# Patient Record
Sex: Female | Born: 1965 | Race: White | Hispanic: No | Smoking: Current every day smoker
Health system: Southern US, Community
[De-identification: ages and names within clinical notes are randomized; demographics above are authoritative.]

## PROBLEM LIST (undated history)

## (undated) DIAGNOSIS — K760 Fatty (change of) liver, not elsewhere classified: Secondary | ICD-10-CM

## (undated) DIAGNOSIS — G40909 Epilepsy, unspecified, not intractable, without status epilepticus: Secondary | ICD-10-CM

## (undated) HISTORY — PX: ABLATION: SHX5711

## (undated) HISTORY — PX: TUBAL LIGATION: SHX77

---

## 2011-06-27 DIAGNOSIS — N926 Irregular menstruation, unspecified: Secondary | ICD-10-CM | POA: Diagnosis not present

## 2011-11-06 DIAGNOSIS — Z0000 Encounter for general adult medical examination without abnormal findings: Secondary | ICD-10-CM | POA: Diagnosis not present

## 2011-11-06 DIAGNOSIS — J209 Acute bronchitis, unspecified: Secondary | ICD-10-CM | POA: Diagnosis not present

## 2011-11-06 DIAGNOSIS — G40309 Generalized idiopathic epilepsy and epileptic syndromes, not intractable, without status epilepticus: Secondary | ICD-10-CM | POA: Diagnosis not present

## 2011-11-06 DIAGNOSIS — Z79899 Other long term (current) drug therapy: Secondary | ICD-10-CM | POA: Diagnosis not present

## 2011-11-06 DIAGNOSIS — R1011 Right upper quadrant pain: Secondary | ICD-10-CM | POA: Diagnosis not present

## 2012-06-08 DIAGNOSIS — Z87891 Personal history of nicotine dependence: Secondary | ICD-10-CM | POA: Diagnosis not present

## 2012-06-08 DIAGNOSIS — J1100 Influenza due to unidentified influenza virus with unspecified type of pneumonia: Secondary | ICD-10-CM | POA: Diagnosis not present

## 2012-11-16 DIAGNOSIS — G40909 Epilepsy, unspecified, not intractable, without status epilepticus: Secondary | ICD-10-CM | POA: Diagnosis not present

## 2012-11-16 DIAGNOSIS — F17200 Nicotine dependence, unspecified, uncomplicated: Secondary | ICD-10-CM | POA: Diagnosis not present

## 2012-11-18 DIAGNOSIS — Z01419 Encounter for gynecological examination (general) (routine) without abnormal findings: Secondary | ICD-10-CM | POA: Diagnosis not present

## 2013-03-29 DIAGNOSIS — R413 Other amnesia: Secondary | ICD-10-CM | POA: Diagnosis not present

## 2013-03-29 DIAGNOSIS — G40909 Epilepsy, unspecified, not intractable, without status epilepticus: Secondary | ICD-10-CM | POA: Diagnosis not present

## 2014-01-31 DIAGNOSIS — Z79899 Other long term (current) drug therapy: Secondary | ICD-10-CM | POA: Diagnosis not present

## 2014-01-31 DIAGNOSIS — G40909 Epilepsy, unspecified, not intractable, without status epilepticus: Secondary | ICD-10-CM | POA: Diagnosis not present

## 2015-03-16 DIAGNOSIS — R5383 Other fatigue: Secondary | ICD-10-CM | POA: Diagnosis not present

## 2015-03-16 DIAGNOSIS — Z5181 Encounter for therapeutic drug level monitoring: Secondary | ICD-10-CM | POA: Diagnosis not present

## 2015-03-16 DIAGNOSIS — G40909 Epilepsy, unspecified, not intractable, without status epilepticus: Secondary | ICD-10-CM | POA: Diagnosis not present

## 2015-03-16 DIAGNOSIS — E785 Hyperlipidemia, unspecified: Secondary | ICD-10-CM | POA: Diagnosis not present

## 2015-03-23 DIAGNOSIS — Z79899 Other long term (current) drug therapy: Secondary | ICD-10-CM | POA: Diagnosis not present

## 2015-03-23 DIAGNOSIS — G4701 Insomnia due to medical condition: Secondary | ICD-10-CM | POA: Diagnosis not present

## 2015-03-23 DIAGNOSIS — G40909 Epilepsy, unspecified, not intractable, without status epilepticus: Secondary | ICD-10-CM | POA: Diagnosis not present

## 2016-03-05 DIAGNOSIS — Z79899 Other long term (current) drug therapy: Secondary | ICD-10-CM | POA: Diagnosis not present

## 2016-03-05 DIAGNOSIS — G40909 Epilepsy, unspecified, not intractable, without status epilepticus: Secondary | ICD-10-CM | POA: Diagnosis not present

## 2016-03-05 DIAGNOSIS — G4452 New daily persistent headache (NDPH): Secondary | ICD-10-CM | POA: Diagnosis not present

## 2016-03-05 DIAGNOSIS — G4701 Insomnia due to medical condition: Secondary | ICD-10-CM | POA: Diagnosis not present

## 2016-06-30 ENCOUNTER — Emergency Department (HOSPITAL_COMMUNITY)
Admission: EM | Admit: 2016-06-30 | Discharge: 2016-06-30 | Disposition: A | Discharge status: Home / Self Care | Payer: Medicare Other | Attending: Emergency Medicine | Admitting: Emergency Medicine

## 2016-06-30 ENCOUNTER — Encounter (HOSPITAL_COMMUNITY): Payer: Self-pay | Admitting: Emergency Medicine

## 2016-06-30 DIAGNOSIS — N390 Urinary tract infection, site not specified: Secondary | ICD-10-CM | POA: Insufficient documentation

## 2016-06-30 DIAGNOSIS — F17210 Nicotine dependence, cigarettes, uncomplicated: Secondary | ICD-10-CM | POA: Insufficient documentation

## 2016-06-30 DIAGNOSIS — R300 Dysuria: Secondary | ICD-10-CM | POA: Diagnosis present

## 2016-06-30 HISTORY — DX: Epilepsy, unspecified, not intractable, without status epilepticus: G40.909

## 2016-06-30 LAB — URINALYSIS, ROUTINE W REFLEX MICROSCOPIC
Bilirubin Urine: NEGATIVE
Glucose, UA: NEGATIVE mg/dL
Ketones, ur: NEGATIVE mg/dL
Nitrite: NEGATIVE
Protein, ur: NEGATIVE mg/dL
Specific Gravity, Urine: 1.01 (ref 1.005–1.030)
pH: 6 (ref 5.0–8.0)

## 2016-06-30 LAB — PREGNANCY, URINE: Preg Test, Ur: NEGATIVE

## 2016-06-30 MED ORDER — PHENAZOPYRIDINE HCL 200 MG PO TABS: 200.0000 mg | ORAL_TABLET | Freq: Three times a day (TID) | ORAL | 0 refills | Status: DC

## 2016-06-30 MED ORDER — CEPHALEXIN 500 MG PO CAPS: 500.0000 mg | ORAL_CAPSULE | Freq: Four times a day (QID) | ORAL | 0 refills | Status: DC

## 2016-06-30 MED ORDER — CEPHALEXIN 500 MG PO CAPS
500.0000 mg | ORAL_CAPSULE | Freq: Once | ORAL | Status: AC
  Administered 2016-06-30: 500 mg via ORAL
  Filled 2016-06-30: qty 1

## 2016-06-30 NOTE — ED Triage Notes (Signed)
Patient c/o pressure with urination that started Thursday. Patient now states hematuria and frequency.

## 2016-06-30 NOTE — Discharge Instructions (Signed)
Plenty of water.  Take the antibiotic as directed until its finished.  Return here for any worsening symptoms

## 2016-06-30 NOTE — ED Provider Notes (Signed)
[facility]-EMERGENCY DEPT Provider Note   CSN: [id number] Arrival date & time: 06/30/16  1124   By signing my name below, I, Brittany Arellano, attest that this documentation has been prepared under the direction and in the presence of Brittany Wiswell PA-C Electronically Signed: Cynda Arellano, Scribe. 06/30/16. 12:38 PM.   History   Chief Complaint Chief Complaint  Patient presents with  . Dysuria    HPI Comments: Brittany Arellano is a 51 y.o. female who presents to the Emergency Department complaining of sudden-onset, intermittent pressured urination that began 3 days ago. Patient has associated hematuria and frequency. She reports drinking cranberry juice and water with no improvement. She denies any new sexual partners, vaginal bleeding or discharge, fever, back pain, chills, or abdominal pain.   The history is provided by the patient. No language interpreter was used.    Past Medical History:  Diagnosis Date  . Epilepsia (HCC)     There are no active problems to display for this patient.   Past Surgical History:  Procedure Laterality Date  . ABLATION    . TUBAL LIGATION      OB History    Gravida Para Term Preterm AB Living   2 1 1   1 1    SAB TAB Ectopic Multiple Live Births   1               Home Medications    Prior to Admission medications   Not on File    Family History Family History  Problem Relation Age of Onset  . Cancer Mother     Social History Social History  Substance Use Topics  . Smoking status: Current Every Day Smoker    Packs/day: 1.00    Years: 35.00    Types: Cigarettes  . Smokeless tobacco: Never Used  . Alcohol use Yes     Comment: rare     Allergies   Patient has no known allergies.   Review of Systems Review of Systems  Constitutional: Negative for chills and fever.  Cardiovascular: Negative for chest pain.  Gastrointestinal: Negative for abdominal pain, nausea and vomiting.  Genitourinary: Positive for difficulty  urinating, dysuria, hematuria and urgency. Negative for genital sores, vaginal bleeding, vaginal discharge and vaginal pain.  Musculoskeletal: Negative for back pain and myalgias.  Skin: Negative for rash.  Neurological: Negative for dizziness and weakness.     Physical Exam Updated Vital Signs BP 155/67 (BP Location: Left Arm)   Pulse 84   Temp 97.6 F (36.4 C) (Oral)   Resp 16   Ht 5\' 2"  (1.575 m)   Wt 190 lb (86.2 kg)   SpO2 98%   BMI 34.75 kg/m   Physical Exam  Constitutional: She is oriented to person, place, and time. She appears well-developed and well-nourished.  HENT:  Head: Normocephalic and atraumatic.  Eyes: EOM are normal. Pupils are equal, round, and reactive to light.  Neck: Normal range of motion. Neck supple.  Cardiovascular: Normal rate.   Pulmonary/Chest: Effort normal.  Abdominal: Soft. She exhibits no distension.  Mild suprapubic tenderness, no CVA tenderness.   Musculoskeletal: Normal range of motion.  Neurological: She is alert and oriented to person, place, and time.  Skin: Skin is warm and dry.  Psychiatric: She has a normal mood and affect.  Nursing note and vitals reviewed.    ED Treatments / Results  DIAGNOSTIC STUDIES: Oxygen Saturation is 98% on RA, normal by my interpretation.    COORDINATION OF CARE: 12:36 PM Discussed treatment  plan with pt at bedside and pt agreed to plan.  Labs (all labs ordered are listed, but only abnormal results are displayed) Labs Reviewed  URINE CULTURE - Abnormal; Notable for the following:       Result Value   Culture MULTIPLE SPECIES PRESENT, SUGGEST RECOLLECTION (*)    All other components within normal limits  URINALYSIS, ROUTINE W REFLEX MICROSCOPIC - Abnormal; Notable for the following:    APPearance HAZY (*)    Hgb urine dipstick LARGE (*)    Leukocytes, UA LARGE (*)    Bacteria, UA FEW (*)    All other components within normal limits  PREGNANCY, URINE    EKG  EKG Interpretation None         Radiology No results found.  Procedures Procedures (including critical care time)  Medications Ordered in ED Medications - No data to display   Initial Impression / Assessment and Plan / ED Course  I have reviewed the triage vital signs and the nursing notes.  Pertinent labs & imaging results that were available during my care of the patient were reviewed by me and considered in my medical decision making (see chart for details).  Clinical Course    Pt well appearing, non-toxic  No clinical symptoms to suggest pyelonephritis. Patient is well appearing. Patient agrees to treatment plan with keflex and pyridium.  Urine culture pending   Final Clinical Impressions(s) / ED Diagnoses   Final diagnoses:  Urinary tract infection in female    New Prescriptions New Prescriptions   No medications on file   I personally performed the services described in this documentation, which was scribed in my presence. The recorded information has been reviewed and is accurate.     Pauline Ausammy Yanel Dombrosky, PA-C 07/03/16 1933    Benjiman CoreNathan Pickering, MD 07/04/16 [phone]

## 2016-07-02 LAB — URINE CULTURE

## 2017-06-05 ENCOUNTER — Encounter: Payer: Self-pay | Admitting: Internal Medicine

## 2017-07-28 ENCOUNTER — Ambulatory Visit: Payer: Medicare Other | Admitting: Gastroenterology

## 2017-09-03 ENCOUNTER — Ambulatory Visit (INDEPENDENT_AMBULATORY_CARE_PROVIDER_SITE_OTHER): Payer: Medicare HMO | Admitting: Gastroenterology

## 2017-09-03 ENCOUNTER — Encounter: Payer: Self-pay | Admitting: Gastroenterology

## 2017-09-03 ENCOUNTER — Telehealth: Payer: Self-pay

## 2017-09-03 ENCOUNTER — Other Ambulatory Visit: Payer: Self-pay

## 2017-09-03 DIAGNOSIS — Z1211 Encounter for screening for malignant neoplasm of colon: Secondary | ICD-10-CM

## 2017-09-03 DIAGNOSIS — R69 Illness, unspecified: Secondary | ICD-10-CM | POA: Insufficient documentation

## 2017-09-03 MED ORDER — PEG 3350-KCL-NA BICARB-NACL 420 G PO SOLR: 4000.0000 mL | ORAL | 0 refills | Status: DC

## 2017-09-03 NOTE — Progress Notes (Signed)
cc'ed to pcp °

## 2017-09-03 NOTE — Assessment & Plan Note (Signed)
Colonoscopy in the near future.  Given seizure medications, plan for deep sedation.  I have discussed the risks, alternatives, benefits with regards to but not limited to the risk of reaction to medication, bleeding, infection, perforation and the patient is agreeable to proceed. Written consent to be obtained.

## 2017-09-03 NOTE — Progress Notes (Signed)
[facility]  [facility]  Dr. Janeece RiggersZhou-Talbert  Primary Gastroenterologist:  Roetta SessionsMichael Rourk, MD   Chief Complaint  Patient presents with  . Colon Cancer Screening    consult    HPI:  Tyler Pitaammie Maisie Fushomas is a 52 y.o. female here to schedule first ever colonoscopy at the request of Dr. Janeece RiggersZhou-Talbert.   Patient has history of epilepsy. Has been on chronic seizures medication. Last seizure 10 years ago.  Denies constipation, diarrhea, melena, rectal bleeding.  No abdominal pain.  Intermittent heartburn, takes omeprazole as needed.  No dysphagia.  NO nausea or vomiting.  No weight loss.    Current Outpatient Medications  Medication Sig Dispense Refill  . ibuprofen (ADVIL,MOTRIN) 200 MG tablet Take 200 mg by mouth every 6 (six) hours as needed.    . lamoTRIgine (LAMICTAL) 150 MG tablet Take 150 mg by mouth daily.    . Omega-3 Fatty Acids (FISH OIL) 1000 MG CAPS Take by mouth daily. Has not started yet 09/03/17    . omeprazole (PRILOSEC) 20 MG capsule Take 20 mg by mouth daily as needed.    . zonisamide (ZONEGRAN) 100 MG capsule Take 100 mg by mouth daily.     No current facility-administered medications for this visit.     Allergies as of 09/03/2017  . (No Known Allergies)    Past Medical History:  Diagnosis Date  . Epilepsia [facility](HCC)     Past Surgical History:  Procedure Laterality Date  . ABLATION    . TUBAL LIGATION      Family History  Problem Relation Age of Onset  . Cancer Mother        ovarian in the 81980s  . Cervical cancer Maternal Grandmother   . Lung cancer Other        3 paternal aunts  . Cancer Paternal Uncle        unknown type  . Colon cancer Neg Hx     Social History   Socioeconomic History  . Marital status: Divorced    Spouse name: Not on file  . Number of children: Not on file  . Years of education: Not on file  . Highest education level: Not on file  Social Needs  . Financial resource strain: Not on file  . Food  insecurity - worry: Not on file  . Food insecurity - inability: Not on file  . Transportation needs - medical: Not on file  . Transportation needs - non-medical: Not on file  Occupational History  . Not on file  Tobacco Use  . Smoking status: Current Every Day Smoker    Packs/day: 1.00    Years: 35.00    Pack years: 35.00    Types: Cigarettes  . Smokeless tobacco: Never Used  Substance and Sexual Activity  . Alcohol use: Yes    Comment: rare  . Drug use: No  . Sexual activity: Yes    Birth control/protection: Surgical  Other Topics Concern  . Not on file  Social History Narrative  . Not on file      ROS:  General: Negative for anorexia, weight loss, fever, chills, fatigue, weakness. Eyes: Negative for vision changes.  ENT: Negative for hoarseness, difficulty swallowing , nasal congestion. CV: Negative for chest pain, angina, palpitations, dyspnea on exertion, peripheral edema.  Respiratory: Negative for dyspnea at rest, dyspnea on exertion, cough, sputum, wheezing.  GI: See history of present illness. GU:  Negative for dysuria, hematuria, urinary incontinence, urinary frequency, nocturnal urination.  MS: Negative for joint pain, low back pain.  Derm: Negative for rash or itching.  Neuro: Negative for weakness, abnormal sensation, seizure, frequent headaches, memory loss, confusion.  Psych: Negative for anxiety, depression, suicidal ideation, hallucinations.  Endo: Negative for unusual weight change.  Heme: Negative for bruising or bleeding. Allergy: Negative for rash or hives.    Physical Examination:  BP 140/74   Pulse 74   Temp (!) 97 F (36.1 C) (Oral)   Ht 5\' 2"  (1.575 m)   Wt 185 lb 3.2 oz (84 kg)   BMI 33.87 kg/m    General: Well-nourished, well-developed in no acute distress.  Head: Normocephalic, atraumatic.   Eyes: Conjunctiva pink, no icterus. Mouth: Oropharyngeal mucosa moist and pink , no lesions erythema or exudate. Neck: Supple without  thyromegaly, masses, or lymphadenopathy.  Lungs: Clear to auscultation bilaterally.  Heart: Regular rate and rhythm, no murmurs rubs or gallops.  Abdomen: Bowel sounds are normal, nontender, nondistended, no hepatosplenomegaly or masses, no abdominal bruits or    hernia , no rebound or guarding.   Rectal: not performed Extremities: No lower extremity edema. No clubbing or deformities.  Neuro: Alert and oriented x 4 , grossly normal neurologically.  Skin: Warm and dry, no rash or jaundice.   Psych: Alert and cooperative, normal mood and affect.   Imaging Studies: No results found.

## 2017-09-03 NOTE — Telephone Encounter (Signed)
Called and informed pt of pre-op appt 09/24/17 at 1:45pm. Letter mailed.

## 2017-09-03 NOTE — H&P (View-Only) (Signed)
Primary Care Physician:  [facility]  Dr. Janeece RiggersZhou-Talbert  Primary Gastroenterologist:  Roetta SessionsMichael Rourk, MD   Chief Complaint  Patient presents with  . Colon Cancer Screening    consult    HPI:  Tyler Pitaammie Maisie Fushomas is a 52 y.o. female here to schedule first ever colonoscopy at the request of Dr. Janeece RiggersZhou-Talbert.   Patient has history of epilepsy. Has been on chronic seizures medication. Last seizure 10 years ago.  Denies constipation, diarrhea, melena, rectal bleeding.  No abdominal pain.  Intermittent heartburn, takes omeprazole as needed.  No dysphagia.  NO nausea or vomiting.  No weight loss.    Current Outpatient Medications  Medication Sig Dispense Refill  . ibuprofen (ADVIL,MOTRIN) 200 MG tablet Take 200 mg by mouth every 6 (six) hours as needed.    . lamoTRIgine (LAMICTAL) 150 MG tablet Take 150 mg by mouth daily.    . Omega-3 Fatty Acids (FISH OIL) 1000 MG CAPS Take by mouth daily. Has not started yet 09/03/17    . omeprazole (PRILOSEC) 20 MG capsule Take 20 mg by mouth daily as needed.    . zonisamide (ZONEGRAN) 100 MG capsule Take 100 mg by mouth daily.     No current facility-administered medications for this visit.     Allergies as of 09/03/2017  . (No Known Allergies)    Past Medical History:  Diagnosis Date  . Epilepsia [facility](HCC)     Past Surgical History:  Procedure Laterality Date  . ABLATION    . TUBAL LIGATION      Family History  Problem Relation Age of Onset  . Cancer Mother        ovarian in the 81980s  . Cervical cancer Maternal Grandmother   . Lung cancer Other        3 paternal aunts  . Cancer Paternal Uncle        unknown type  . Colon cancer Neg Hx     Social History   Socioeconomic History  . Marital status: Divorced    Spouse name: Not on file  . Number of children: Not on file  . Years of education: Not on file  . Highest education level: Not on file  Social Needs  . Financial resource strain: Not on file  . Food  insecurity - worry: Not on file  . Food insecurity - inability: Not on file  . Transportation needs - medical: Not on file  . Transportation needs - non-medical: Not on file  Occupational History  . Not on file  Tobacco Use  . Smoking status: Current Every Day Smoker    Packs/day: 1.00    Years: 35.00    Pack years: 35.00    Types: Cigarettes  . Smokeless tobacco: Never Used  Substance and Sexual Activity  . Alcohol use: Yes    Comment: rare  . Drug use: No  . Sexual activity: Yes    Birth control/protection: Surgical  Other Topics Concern  . Not on file  Social History Narrative  . Not on file      ROS:  General: Negative for anorexia, weight loss, fever, chills, fatigue, weakness. Eyes: Negative for vision changes.  ENT: Negative for hoarseness, difficulty swallowing , nasal congestion. CV: Negative for chest pain, angina, palpitations, dyspnea on exertion, peripheral edema.  Respiratory: Negative for dyspnea at rest, dyspnea on exertion, cough, sputum, wheezing.  GI: See history of present illness. GU:  Negative for dysuria, hematuria, urinary incontinence, urinary frequency, nocturnal urination.  MS: Negative for joint pain, low back pain.  Derm: Negative for rash or itching.  Neuro: Negative for weakness, abnormal sensation, seizure, frequent headaches, memory loss, confusion.  Psych: Negative for anxiety, depression, suicidal ideation, hallucinations.  Endo: Negative for unusual weight change.  Heme: Negative for bruising or bleeding. Allergy: Negative for rash or hives.    Physical Examination:  BP 140/74   Pulse 74   Temp (!) 97 F (36.1 C) (Oral)   Ht 5\' 2"  (1.575 m)   Wt 185 lb 3.2 oz (84 kg)   BMI 33.87 kg/m    General: Well-nourished, well-developed in no acute distress.  Head: Normocephalic, atraumatic.   Eyes: Conjunctiva pink, no icterus. Mouth: Oropharyngeal mucosa moist and pink , no lesions erythema or exudate. Neck: Supple without  thyromegaly, masses, or lymphadenopathy.  Lungs: Clear to auscultation bilaterally.  Heart: Regular rate and rhythm, no murmurs rubs or gallops.  Abdomen: Bowel sounds are normal, nontender, nondistended, no hepatosplenomegaly or masses, no abdominal bruits or    hernia , no rebound or guarding.   Rectal: not performed Extremities: No lower extremity edema. No clubbing or deformities.  Neuro: Alert and oriented x 4 , grossly normal neurologically.  Skin: Warm and dry, no rash or jaundice.   Psych: Alert and cooperative, normal mood and affect.   Imaging Studies: No results found.

## 2017-09-03 NOTE — Patient Instructions (Signed)
1. Colonoscopy as scheduled. See separate instructions.  

## 2017-09-23 NOTE — Patient Instructions (Signed)
Brittany Arellano  09/23/2017     @PREFPERIOPPHARMACY @   Your procedure is scheduled on  09/29/2017   Report to [facility] at  1215   A.M.  Call this number if you have problems the morning of surgery:  [phone]   Remember:  Do not eat food or drink liquids after midnight.  Take these medicines the morning of surgery with A SIP OF WATER  prilosec.   Do not wear jewelry, make-up or nail polish.  Do not wear lotions, powders, or perfumes, or deodorant.  Do not shave 48 hours prior to surgery.  Men may shave face and neck.  Do not bring valuables to the hospital.  [facility] belongings or valuables.  Contacts, dentures or bridgework may not be worn into surgery.  Leave your suitcase in the car.  After surgery it may be brought to your room.  For patients admitted to the hospital, discharge time will be determined by your treatment team.  Patients discharged the day of surgery will not be allowed to drive home.   Name and phone number of your driver:   family Special instructions:  Follow the diet and prep instructions given to you by Dr Luvenia Starchourk's office.  Please read over the following fact sheets that you were given. Anesthesia Post-op Instructions and Care and Recovery After Surgery       Colonoscopy, Adult A colonoscopy is an exam to look at the large intestine. It is done to check for problems, such as:  Lumps (tumors).  Growths (polyps).  Swelling (inflammation).  Bleeding.  What happens before the procedure? Eating and drinking Follow instructions from your doctor about eating and drinking. These instructions may include:  A few days before the procedure - follow a low-fiber diet. ? Avoid nuts. ? Avoid seeds. ? Avoid dried fruit. ? Avoid raw fruits. ? Avoid vegetables.  1-3 days before the procedure - follow a clear liquid diet. Avoid liquids that have red or purple dye. Drink only clear liquids, such as: ? Clear  broth or bouillon. ? Black coffee or tea. ? Clear juice. ? Clear soft drinks or sports drinks. ? Gelatin dessert. ? Popsicles.  On the day of the procedure - do not eat or drink anything during the 2 hours before the procedure.  Bowel prep If you were prescribed an oral bowel prep:  Take it as told by your doctor. Starting the day before your procedure, you will need to drink a lot of liquid. The liquid will cause you to poop (have bowel movements) until your poop is almost clear or light green.  If your skin or butt gets irritated from diarrhea, you may: ? Wipe the area with wipes that have medicine in them, such as adult wet wipes with aloe and vitamin E. ? Put something on your skin that soothes the area, such as petroleum jelly.  If you throw up (vomit) while drinking the bowel prep, take a break for up to 60 minutes. Then begin the bowel prep again. If you keep throwing up and you cannot take the bowel prep without throwing up, call your doctor.  General instructions  Ask your doctor about changing or stopping your normal medicines. This is important if you take diabetes medicines or blood thinners.  Plan to have someone take you home from the hospital or clinic. What happens during the procedure?  An IV tube may be put  into one of your veins.  You will be given medicine to help you relax (sedative).  To reduce your risk of infection: ? Your doctors will wash their hands. ? Your anal area will be washed with soap.  You will be asked to lie on your side with your knees bent.  Your doctor will get a long, thin, flexible tube ready. The tube will have a camera and a light on the end.  The tube will be put into your anus.  The tube will be gently put into your large intestine.  Air will be delivered into your large intestine to keep it open. You may feel some pressure or cramping.  The camera will be used to take photos.  A small tissue sample may be removed from your  body to be looked at under a microscope (biopsy). If any possible problems are found, the tissue will be sent to a lab for testing.  If small growths are found, your doctor may remove them and have them checked for cancer.  The tube that was put into your anus will be slowly removed. The procedure may vary among doctors and hospitals. What happens after the procedure?  Your doctor will check on you often until the medicines you were given have worn off.  Do not drive for 24 hours after the procedure.  You may have a small amount of blood in your poop.  You may pass gas.  You may have mild cramps or bloating in your belly (abdomen).  It is up to you to get the results of your procedure. Ask your doctor, or the department performing the procedure, when your results will be ready. This information is not intended to replace advice given to you by your health care provider. Make sure you discuss any questions you have with your health care provider. Document Released: 07/06/2010 Document Revised: 04/03/2016 Document Reviewed: 08/15/2015 Elsevier Interactive Patient Education  2017 [employer]  Colonoscopy, Adult, Care After This sheet gives you information about how to care for yourself after your procedure. Your health care provider may also give you more specific instructions. If you have problems or questions, contact your health care provider. What can I expect after the procedure? After the procedure, it is common to have:  A small amount of blood in your stool for 24 hours after the procedure.  Some gas.  Mild abdominal cramping or bloating.  Follow these instructions at home: General instructions   For the first 24 hours after the procedure: ? Do not drive or use machinery. ? Do not sign important documents. ? Do not drink alcohol. ? Do your regular daily activities at a slower pace than normal. ? Eat soft, easy-to-digest foods. ? Rest often.  Take over-the-counter  or prescription medicines only as told by your health care provider.  It is up to you to get the results of your procedure. Ask your health care provider, or the department performing the procedure, when your results will be ready. Relieving cramping and bloating  Try walking around when you have cramps or feel bloated.  Apply heat to your abdomen as told by your health care provider. Use a heat source that your health care provider recommends, such as a moist heat pack or a heating pad. ? Place a towel between your skin and the heat source. ? Leave the heat on for 20-30 minutes. ? Remove the heat if your skin turns bright red. This is especially important if you are unable  to feel pain, heat, or cold. You may have a greater risk of getting burned. Eating and drinking  Drink enough fluid to keep your urine clear or pale yellow.  Resume your normal diet as instructed by your health care provider. Avoid heavy or fried foods that are hard to digest.  Avoid drinking alcohol for as long as instructed by your health care provider. Contact a health care provider if:  You have blood in your stool 2-3 days after the procedure. Get help right away if:  You have more than a small spotting of blood in your stool.  You pass large blood clots in your stool.  Your abdomen is swollen.  You have nausea or vomiting.  You have a fever.  You have increasing abdominal pain that is not relieved with medicine. This information is not intended to replace advice given to you by your health care provider. Make sure you discuss any questions you have with your health care provider. Document Released: 01/16/2004 Document Revised: 02/26/2016 Document Reviewed: 08/15/2015 Elsevier Interactive Patient Education  2018 [place] Anesthesia is a term that refers to techniques, procedures, and medicines that help a person stay safe and comfortable during a medical procedure.  Monitored anesthesia care, or sedation, is one type of anesthesia. Your anesthesia specialist may recommend sedation if you will be having a procedure that does not require you to be unconscious, such as:  Cataract surgery.  A dental procedure.  A biopsy.  A colonoscopy.  During the procedure, you may receive a medicine to help you relax (sedative). There are three levels of sedation:  Mild sedation. At this level, you may feel awake and relaxed. You will be able to follow directions.  Moderate sedation. At this level, you will be sleepy. You may not remember the procedure.  Deep sedation. At this level, you will be asleep. You will not remember the procedure.  The more medicine you are given, the deeper your level of sedation will be. Depending on how you respond to the procedure, the anesthesia specialist may change your level of sedation or the type of anesthesia to fit your needs. An anesthesia specialist will monitor you closely during the procedure. Let your health care provider know about:  Any allergies you have.  All medicines you are taking, including vitamins, herbs, eye drops, creams, and over-the-counter medicines.  Any use of steroids (by mouth or as a cream).  Any problems you or family members have had with sedatives and anesthetic medicines.  Any blood disorders you have.  Any surgeries you have had.  Any medical conditions you have, such as sleep apnea.  Whether you are pregnant or may be pregnant.  Any use of cigarettes, alcohol, or street drugs. What are the risks? Generally, this is a safe procedure. However, problems may occur, including:  Getting too much medicine (oversedation).  Nausea.  Allergic reaction to medicines.  Trouble breathing. If this happens, a breathing tube may be used to help with breathing. It will be removed when you are awake and breathing on your own.  Heart trouble.  Lung trouble.  Before the procedure Staying  hydrated Follow instructions from your health care provider about hydration, which may include:  Up to 2 hours before the procedure - you may continue to drink clear liquids, such as water, clear fruit juice, black coffee, and plain tea.  Eating and drinking restrictions Follow instructions from your health care provider about eating and drinking, which may  include:  8 hours before the procedure - stop eating heavy meals or foods such as meat, fried foods, or fatty foods.  6 hours before the procedure - stop eating light meals or foods, such as toast or cereal.  6 hours before the procedure - stop drinking milk or drinks that contain milk.  2 hours before the procedure - stop drinking clear liquids.  Medicines Ask your health care provider about:  Changing or stopping your regular medicines. This is especially important if you are taking diabetes medicines or blood thinners.  Taking medicines such as aspirin and ibuprofen. These medicines can thin your blood. Do not take these medicines before your procedure if your health care provider instructs you not to.  Tests and exams  You will have a physical exam.  You may have blood tests done to show: ? How well your kidneys and liver are working. ? How well your blood can clot.  General instructions  Plan to have someone take you home from the hospital or clinic.  If you will be going home right after the procedure, plan to have someone with you for 24 hours.  What happens during the procedure?  Your blood pressure, heart rate, breathing, level of pain and overall condition will be monitored.  An IV tube will be inserted into one of your veins.  Your anesthesia specialist will give you medicines as needed to keep you comfortable during the procedure. This may mean changing the level of sedation.  The procedure will be performed. After the procedure  Your blood pressure, heart rate, breathing rate, and blood oxygen level  will be monitored until the medicines you were given have worn off.  Do not drive for 24 hours if you received a sedative.  You may: ? Feel sleepy, clumsy, or nauseous. ? Feel forgetful about what happened after the procedure. ? Have a sore throat if you had a breathing tube during the procedure. ? Vomit. This information is not intended to replace advice given to you by your health care provider. Make sure you discuss any questions you have with your health care provider. Document Released: 02/27/2005 Document Revised: 11/10/2015 Document Reviewed: 09/24/2015 Elsevier Interactive Patient Education  2018 [place], Care After These instructions provide you with information about caring for yourself after your procedure. Your health care provider may also give you more specific instructions. Your treatment has been planned according to current medical practices, but problems sometimes occur. Call your health care provider if you have any problems or questions after your procedure. What can I expect after the procedure? After your procedure, it is common to:  Feel sleepy for several hours.  Feel clumsy and have poor balance for several hours.  Feel forgetful about what happened after the procedure.  Have poor judgment for several hours.  Feel nauseous or vomit.  Have a sore throat if you had a breathing tube during the procedure.  Follow these instructions at home: For at least 24 hours after the procedure:   Do not: ? Participate in activities in which you could fall or become injured. ? Drive. ? Use heavy machinery. ? Drink alcohol. ? Take sleeping pills or medicines that cause drowsiness. ? Make important decisions or sign legal documents. ? Take care of children on your own.  Rest. Eating and drinking  Follow the diet that is recommended by your health care provider.  If you vomit, drink water, juice, or soup when you can drink without  vomiting.  Make sure you have little or no nausea before eating solid foods. General instructions  Have a responsible adult stay with you until you are awake and alert.  Take over-the-counter and prescription medicines only as told by your health care provider.  If you smoke, do not smoke without supervision.  Keep all follow-up visits as told by your health care provider. This is important. Contact a health care provider if:  You keep feeling nauseous or you keep vomiting.  You feel light-headed.  You develop a rash.  You have a fever. Get help right away if:  You have trouble breathing. This information is not intended to replace advice given to you by your health care provider. Make sure you discuss any questions you have with your health care provider. Document Released: 09/24/2015 Document Revised: 01/24/2016 Document Reviewed: 09/24/2015 Elsevier Interactive Patient Education  [employer].

## 2017-09-24 ENCOUNTER — Encounter (HOSPITAL_COMMUNITY)
Admission: RE | Admit: 2017-09-24 | Discharge: 2017-09-24 | Disposition: A | Discharge status: Home / Self Care | Payer: Medicare HMO | Source: Ambulatory Visit | Attending: Internal Medicine | Admitting: Internal Medicine

## 2017-09-24 ENCOUNTER — Encounter (HOSPITAL_COMMUNITY): Payer: Self-pay

## 2017-09-24 ENCOUNTER — Other Ambulatory Visit: Payer: Self-pay

## 2017-09-24 DIAGNOSIS — Z01812 Encounter for preprocedural laboratory examination: Secondary | ICD-10-CM | POA: Diagnosis not present

## 2017-09-24 LAB — CBC WITH DIFFERENTIAL/PLATELET
BASOS ABS: 0 10*3/uL (ref 0.0–0.1)
Basophils Relative: 0 %
EOS PCT: 2 %
Eosinophils Absolute: 0.2 10*3/uL (ref 0.0–0.7)
HEMATOCRIT: 47.6 % — AB (ref 36.0–46.0)
HEMOGLOBIN: 15.5 g/dL — AB (ref 12.0–15.0)
LYMPHS ABS: 3.1 10*3/uL (ref 0.7–4.0)
LYMPHS PCT: 38 %
MCH: 27.8 pg (ref 26.0–34.0)
MCHC: 32.6 g/dL (ref 30.0–36.0)
MCV: 85.3 fL (ref 78.0–100.0)
Monocytes Absolute: 0.5 10*3/uL (ref 0.1–1.0)
Monocytes Relative: 6 %
NEUTROS ABS: 4.4 10*3/uL (ref 1.7–7.7)
NEUTROS PCT: 54 %
PLATELETS: 297 10*3/uL (ref 150–400)
RBC: 5.58 MIL/uL — AB (ref 3.87–5.11)
RDW: 14.2 % (ref 11.5–15.5)
WBC: 8.2 10*3/uL (ref 4.0–10.5)

## 2017-09-24 LAB — HCG, SERUM, QUALITATIVE: Preg, Serum: NEGATIVE

## 2017-09-29 ENCOUNTER — Ambulatory Visit (HOSPITAL_COMMUNITY)
Admission: RE | Admit: 2017-09-29 | Discharge: 2017-09-29 | Disposition: A | Discharge status: Home / Self Care | Payer: Medicare HMO | Source: Ambulatory Visit | Attending: Internal Medicine | Admitting: Internal Medicine

## 2017-09-29 ENCOUNTER — Other Ambulatory Visit: Payer: Self-pay

## 2017-09-29 ENCOUNTER — Ambulatory Visit (HOSPITAL_COMMUNITY): Payer: Medicare HMO | Admitting: Anesthesiology

## 2017-09-29 ENCOUNTER — Encounter (HOSPITAL_COMMUNITY)
Admission: RE | Disposition: A | Discharge status: Home / Self Care | Payer: Self-pay | Source: Ambulatory Visit | Attending: Internal Medicine

## 2017-09-29 ENCOUNTER — Encounter (HOSPITAL_COMMUNITY): Payer: Self-pay | Admitting: *Deleted

## 2017-09-29 DIAGNOSIS — Z1211 Encounter for screening for malignant neoplasm of colon: Secondary | ICD-10-CM | POA: Diagnosis not present

## 2017-09-29 DIAGNOSIS — G40909 Epilepsy, unspecified, not intractable, without status epilepticus: Secondary | ICD-10-CM | POA: Diagnosis not present

## 2017-09-29 DIAGNOSIS — K640 First degree hemorrhoids: Secondary | ICD-10-CM | POA: Diagnosis not present

## 2017-09-29 DIAGNOSIS — Z79899 Other long term (current) drug therapy: Secondary | ICD-10-CM | POA: Diagnosis not present

## 2017-09-29 DIAGNOSIS — K635 Polyp of colon: Secondary | ICD-10-CM

## 2017-09-29 DIAGNOSIS — F17210 Nicotine dependence, cigarettes, uncomplicated: Secondary | ICD-10-CM | POA: Diagnosis not present

## 2017-09-29 HISTORY — PX: COLONOSCOPY WITH PROPOFOL: SHX5780

## 2017-09-29 HISTORY — PX: POLYPECTOMY: SHX5525

## 2017-09-29 SURGERY — COLONOSCOPY WITH PROPOFOL
Anesthesia: Monitor Anesthesia Care

## 2017-09-29 MED ORDER — MIDAZOLAM HCL 5 MG/5ML IJ SOLN
INTRAMUSCULAR | Status: DC | PRN
  Administered 2017-09-29: 2 mg via INTRAVENOUS

## 2017-09-29 MED ORDER — PROPOFOL 500 MG/50ML IV EMUL
INTRAVENOUS | Status: DC | PRN
  Administered 2017-09-29: 150 ug/kg/min via INTRAVENOUS

## 2017-09-29 MED ORDER — PROPOFOL 10 MG/ML IV BOLUS
INTRAVENOUS | Status: DC | PRN
  Administered 2017-09-29 (×2): 20 mg via INTRAVENOUS

## 2017-09-29 MED ORDER — MIDAZOLAM HCL 2 MG/2ML IJ SOLN
INTRAMUSCULAR | Status: AC
  Filled 2017-09-29: qty 2

## 2017-09-29 MED ORDER — STERILE WATER FOR IRRIGATION IR SOLN
Status: DC | PRN
  Administered 2017-09-29: 100 mL

## 2017-09-29 MED ORDER — LACTATED RINGERS IV SOLN
INTRAVENOUS | Status: DC
  Administered 2017-09-29: 1000 mL via INTRAVENOUS

## 2017-09-29 NOTE — Interval H&P Note (Signed)
History and Physical Interval Note:  09/29/2017 2:24 PM  Brittany Arellano  has presented today for surgery, with the diagnosis of colon cancer screening  The various methods of treatment have been discussed with the patient and family. After consideration of risks, benefits and other options for treatment, the patient has consented to  Procedure(s) with comments: COLONOSCOPY WITH PROPOFOL (N/A) - 2:15pm as a surgical intervention .  The patient's history has been reviewed, patient examined, no change in status, stable for surgery.  I have reviewed the patient's chart and labs.  Questions were answered to the patient's satisfaction.     No change. Screening colonoscopy are planned.  The risks, benefits, limitations, alternatives and imponderables have been reviewed with the patient. Questions have been answered. All parties are agreeable.    Brittany Arellano

## 2017-09-29 NOTE — Transfer of Care (Signed)
Immediate Anesthesia Transfer of Care Note  Patient: Brittany Arellano  Procedure(s) Performed: COLONOSCOPY WITH PROPOFOL (N/A ) POLYPECTOMY  Patient Location: PACU  Anesthesia Type:MAC  Level of Consciousness: awake, alert  and oriented  Airway & Oxygen Therapy: Patient Spontanous Breathing  Post-op Assessment: Report given to RN  Post vital signs: Reviewed and stable  Last Vitals:  Vitals Value Taken Time  BP 106/64 09/29/2017  3:34 PM  Temp    Pulse 89 09/29/2017  3:36 PM  Resp 13 09/29/2017  3:36 PM  SpO2 97 % 09/29/2017  3:36 PM  Vitals shown include unvalidated device data.  Last Pain:  Vitals:   09/29/17 1527  TempSrc:   PainSc: 0-No pain      Patients Stated Pain Goal: 8 (28/36/62 [id number])  Complications: No apparent anesthesia complications

## 2017-09-29 NOTE — Discharge Instructions (Signed)
°Colonoscopy °Discharge Instructions ° °Read the instructions outlined below and refer to this sheet in the next few weeks. These discharge instructions provide you with general information on caring for yourself after you leave the hospital. Your doctor may also give you specific instructions. While your treatment has been planned according to the most current medical practices available, unavoidable complications occasionally occur. If you have any problems or questions after discharge, call Dr. Rourk at [phone]. °ACTIVITY °· You may resume your regular activity, but move at a slower pace for the next 24 hours.  °· Take frequent rest periods for the next 24 hours.  °· Walking will help get rid of the air and reduce the bloated feeling in your belly (abdomen).  °· No driving for 24 hours (because of the medicine (anesthesia) used during the test).   °· Do not sign any important legal documents or operate any machinery for 24 hours (because of the anesthesia used during the test).  °NUTRITION °· Drink plenty of fluids.  °· You may resume your normal diet as instructed by your doctor.  °· Begin with a light meal and progress to your normal diet. Heavy or fried foods are harder to digest and may make you feel sick to your stomach (nauseated).  °· Avoid alcoholic beverages for 24 hours or as instructed.  °MEDICATIONS °· You may resume your normal medications unless your doctor tells you otherwise.  °WHAT YOU CAN EXPECT TODAY °· Some feelings of bloating in the abdomen.  °· Passage of more gas than usual.  °· Spotting of blood in your stool or on the toilet paper.  °IF YOU HAD POLYPS REMOVED DURING THE COLONOSCOPY: °· No aspirin products for 7 days or as instructed.  °· No alcohol for 7 days or as instructed.  °· Eat a soft diet for the next 24 hours.  °FINDING OUT THE RESULTS OF YOUR TEST °Not all test results are available during your visit. If your test results are not back during the visit, make an appointment  with your caregiver to find out the results. Do not assume everything is normal if you have not heard from your caregiver or the medical facility. It is important for you to follow up on all of your test results.  °SEEK IMMEDIATE MEDICAL ATTENTION IF: °· You have more than a spotting of blood in your stool.  °· Your belly is swollen (abdominal distention).  °· You are nauseated or vomiting.  °· You have a temperature over 101.  °· You have abdominal pain or discomfort that is severe or gets worse throughout the day.  ° ° °Colon polyp information provided ° °Further recommendations to follow pending review of pathology report ° ° ° ° °Colon Polyps °Polyps are tissue growths inside the body. Polyps can grow in many places, including the large intestine (colon). A polyp may be a round bump or a mushroom-shaped growth. You could have one polyp or several. °Most colon polyps are noncancerous (benign). However, some colon polyps can become cancerous over time. °What are the causes? °The exact cause of colon polyps is not known. °What increases the risk? °This condition is more likely to develop in people who: °· Have a family history of colon cancer or colon polyps. °· Are older than 50 or older than 45 if they are African American. °· Have inflammatory bowel disease, such as ulcerative colitis or Crohn disease. °· Are overweight. °· Smoke cigarettes. °· Do not get enough exercise. °· Drink too   much alcohol. °· Eat a diet that is: °? High in fat and red meat. °? Low in fiber. °· Had childhood cancer that was treated with abdominal radiation. ° °What are the signs or symptoms? °Most polyps do not cause symptoms. If you have symptoms, they may include: °· Blood coming from your rectum when having a bowel movement. °· Blood in your stool. The stool may look dark red or black. °· A change in bowel habits, such as constipation or diarrhea. ° °How is this diagnosed? °This condition is diagnosed with a colonoscopy. This is a  procedure that uses a lighted, flexible scope to look at the inside of your colon. °How is this treated? °Treatment for this condition involves removing any polyps that are found. Those polyps will then be tested for cancer. If cancer is found, your health care provider will talk to you about options for colon cancer treatment. °Follow these instructions at home: °Diet °· Eat plenty of fiber, such as fruits, vegetables, and whole grains. °· Eat foods that are high in calcium and vitamin D, such as milk, cheese, yogurt, eggs, liver, fish, and broccoli. °· Limit foods high in fat, red meats, and processed meats, such as hot dogs, sausage, bacon, and lunch meats. °· Maintain a healthy weight, or lose weight if recommended by your health care provider. °General instructions °· Do not smoke cigarettes. °· Do not drink alcohol excessively. °· Keep all follow-up visits as told by your health care provider. This is important. This includes keeping regularly scheduled colonoscopies. Talk to your health care provider about when you need a colonoscopy. °· Exercise every day or as told by your health care provider. °Contact a health care provider if: °· You have new or worsening bleeding during a bowel movement. °· You have new or increased blood in your stool. °· You have a change in bowel habits. °· You unexpectedly lose weight. °This information is not intended to replace advice given to you by your health care provider. Make sure you discuss any questions you have with your health care provider. °Document Released: 02/28/2004 Document Revised: 11/09/2015 Document Reviewed: 04/24/2015 °Elsevier Interactive Patient Education © 2018 Elsevier Inc. ° ° °PATIENT INSTRUCTIONS °POST-ANESTHESIA ° °IMMEDIATELY FOLLOWING SURGERY:  Do not drive or operate machinery for the first twenty four hours after surgery.  Do not make any important decisions for twenty four hours after surgery or while taking narcotic pain medications or  sedatives.  If you develop intractable nausea and vomiting or a severe headache please notify your doctor immediately. ° °FOLLOW-UP:  Please make an appointment with your surgeon as instructed. You do not need to follow up with anesthesia unless specifically instructed to do so. ° °WOUND CARE INSTRUCTIONS (if applicable):  Keep a dry clean dressing on the anesthesia/puncture wound site if there is drainage.  Once the wound has quit draining you may leave it open to air.  Generally you should leave the bandage intact for twenty four hours unless there is drainage.  If the epidural site drains for more than 36-48 hours please call the anesthesia department. ° °QUESTIONS?:  Please feel free to call your physician or the hospital operator if you have any questions, and they will be happy to assist you.    ° ° ° °

## 2017-09-29 NOTE — Op Note (Signed)
[facility] Patient Name: Brittany Rombergammie Macdougal Procedure Date: 09/29/2017 2:51 PM MRN: [id number] Date of Birth: [birth year] Attending MD: Gennette Pacobert Michael Cambria Osten , MD CSN: [id number] Age: 52 Admit Type: Outpatient Procedure:                Colonoscopy Indications:              Screening for colorectal malignant neoplasm Providers:                Gennette Pacobert Michael Selma Rodelo, MD, Jannett CelestineAnitra Bell, RN, Edrick Kinsammy                            Vaught, RN Referring MD:              Medicines:                Propofol per Anesthesia Complications:            No immediate complications. Estimated Blood Loss:     Estimated blood loss was minimal. Procedure:                Pre-Anesthesia Assessment:                           - Prior to the procedure, a History and Physical                            was performed, and patient medications and                            allergies were reviewed. The patient's tolerance of                            previous anesthesia was also reviewed. The risks                            and benefits of the procedure and the sedation                            options and risks were discussed with the patient.                            All questions were answered, and informed consent                            was obtained. Prior Anticoagulants: The patient has                            taken no previous anticoagulant or antiplatelet                            agents. ASA Grade Assessment: II - A patient with                            mild systemic disease. After reviewing the risks  and benefits, the patient was deemed in                            satisfactory condition to undergo the procedure.                           After obtaining informed consent, the colonoscope                            was passed under direct vision. Throughout the                            procedure, the patient's blood pressure, pulse, and                            oxygen  saturations were monitored continuously. The                            EC38-[id number] ([id number]) scope was introduced through                            the and advanced to the the cecum, identified by                            appendiceal orifice and ileocecal valve. The                            colonoscopy was performed without difficulty. The                            patient tolerated the procedure well. The quality                            of the bowel preparation was adequate. The                            ileocecal valve, appendiceal orifice, and rectum                            were photographed. The entire colon was well                            visualized. The quality of the bowel preparation                            was adequate. Findings:      The perianal and digital rectal examinations were normal.      Five sessile polyps were found in the recto-sigmoid colon, descending       colon and ascending colon. The polyps were 4 to 6 mm in size. These       polyps were removed with a cold snare. Resection and retrieval were       complete. Estimated blood loss was minimal. Estimated blood loss was       minimal.  Non-bleeding internal hemorrhoids were found during retroflexion. The       hemorrhoids were moderate, medium-sized and Grade I (internal       hemorrhoids that do not prolapse).      The exam was otherwise without abnormality on direct and retroflexion       views. Impression:               - Five 4 to 6 mm polyps at the recto-sigmoid colon,                            in the descending colon and in the ascending colon,                            removed with a cold snare. Resected and retrieved.                           - Non-bleeding internal hemorrhoids.                           - The examination was otherwise normal on direct                            and retroflexion views. Moderate Sedation:      Moderate (conscious) sedation was personally  administered by an       anesthesia professional. The following parameters were monitored: oxygen       saturation, heart rate, blood pressure, respiratory rate, EKG, adequacy       of pulmonary ventilation, and response to care. Total physician       intraservice time was 23 minutes. Recommendation:           - Patient has a contact number available for                            emergencies. The signs and symptoms of potential                            delayed complications were discussed with the                            patient. Return to normal activities tomorrow.                            Written discharge instructions were provided to the                            patient.                           - Resume previous diet.                           - Continue present medications.                           - Repeat colonoscopy date to be determined after  pending pathology results are reviewed for                            surveillance based on pathology results.                           - Return to GI office (date not yet determined). Procedure Code(s):        --- Professional ---                           [phone], Colonoscopy, flexible; with removal of                            tumor(s), polyp(s), or other lesion(s) by snare                            technique Diagnosis Code(s):        --- Professional ---                           Z12.11, Encounter for screening for malignant                            neoplasm of colon                           D12.7, Benign neoplasm of rectosigmoid junction                           D12.4, Benign neoplasm of descending colon                           D12.2, Benign neoplasm of ascending colon                           K64.0, First degree hemorrhoids CPT copyright 2017 American [employer]. All rights reserved. The codes documented in this report are preliminary and upon coder review may  be revised to  meet current compliance requirements. Gerrit Friends. Avett Reineck, MD Gennette Pac, MD 09/29/2017 3:34:06 PM This report has been signed electronically. Number of Addenda: 0

## 2017-09-29 NOTE — Anesthesia Postprocedure Evaluation (Signed)
Anesthesia Post Note  Patient: Brittany Arellano  Procedure(s) Performed: COLONOSCOPY WITH PROPOFOL (N/A ) POLYPECTOMY  Patient location during evaluation: PACU Anesthesia Type: MAC Level of consciousness: awake and alert and oriented Pain management: pain level controlled Vital Signs Assessment: post-procedure vital signs reviewed and stable Respiratory status: spontaneous breathing Cardiovascular status: blood pressure returned to baseline and stable Postop Assessment: no apparent nausea or vomiting Anesthetic complications: no     Last Vitals:  Vitals:   09/29/17 1310 09/29/17 1322  BP: 136/68 134/65  Pulse: 89   Resp: 12   Temp: 36.6 C   SpO2: 97%     Last Pain:  Vitals:   09/29/17 1527  TempSrc:   PainSc: 0-No pain                 Nicci Vaughan

## 2017-09-29 NOTE — Anesthesia Preprocedure Evaluation (Signed)
Anesthesia Evaluation  Patient identified by MRN, date of birth, ID band Patient awake    Reviewed: Allergy & Precautions, H&P , NPO status , Patient's Chart, lab work & pertinent test results, reviewed documented beta blocker date and time   Airway Mallampati: III  TM Distance: >3 FB Neck ROM: full    Dental no notable dental hx. (+) Teeth Intact   Pulmonary neg pulmonary ROS, Current Smoker,    Pulmonary exam normal breath sounds clear to auscultation       Cardiovascular Exercise Tolerance: Good negative cardio ROS   Rhythm:regular Rate:Normal     Neuro/Psych Last seizure reported several years ago; continues on medications negative neurological ROS  negative psych ROS   GI/Hepatic negative GI ROS, Neg liver ROS,   Endo/Other  negative endocrine ROS  Renal/GU negative Renal ROS  negative genitourinary   Musculoskeletal   Abdominal   Peds  Hematology negative hematology ROS (+)   Anesthesia Other Findings   Reproductive/Obstetrics negative OB ROS                             Anesthesia Physical Anesthesia Plan  ASA: II  Anesthesia Plan: MAC   Post-op Pain Management:    Induction:   PONV Risk Score and Plan:   Airway Management Planned:   Additional Equipment:   Intra-op Plan:   Post-operative Plan:   Informed Consent: I have reviewed the patients History and Physical, chart, labs and discussed the procedure including the risks, benefits and alternatives for the proposed anesthesia with the patient or authorized representative who has indicated his/her understanding and acceptance.   Dental Advisory Given  Plan Discussed with: CRNA  Anesthesia Plan Comments:         Anesthesia Quick Evaluation

## 2017-10-04 ENCOUNTER — Encounter: Payer: Self-pay | Admitting: Internal Medicine

## 2017-10-06 ENCOUNTER — Encounter (HOSPITAL_COMMUNITY): Payer: Self-pay | Admitting: Internal Medicine

## 2018-02-12 ENCOUNTER — Other Ambulatory Visit (HOSPITAL_COMMUNITY): Payer: Self-pay | Admitting: Family Medicine

## 2018-02-12 DIAGNOSIS — M6740 Ganglion, unspecified site: Secondary | ICD-10-CM

## 2018-02-19 ENCOUNTER — Ambulatory Visit (HOSPITAL_COMMUNITY)
Admission: RE | Admit: 2018-02-19 | Discharge: 2018-02-19 | Disposition: A | Discharge status: Home / Self Care | Payer: Medicare HMO | Source: Ambulatory Visit | Attending: Family Medicine | Admitting: Family Medicine

## 2018-02-19 DIAGNOSIS — M6740 Ganglion, unspecified site: Secondary | ICD-10-CM | POA: Diagnosis present

## 2018-02-19 DIAGNOSIS — M67431 Ganglion, right wrist: Secondary | ICD-10-CM | POA: Diagnosis not present

## 2018-03-12 ENCOUNTER — Telehealth: Payer: Self-pay | Admitting: Orthopedic Surgery

## 2018-03-12 NOTE — Telephone Encounter (Signed)
Brittany Arellano was referred by [facility] for ganglion cyst of the left hand. The referral was approved by Dr. Romeo Apple.    I called and spoke to the patient.  She said that right now she wants to hold off on making an appointment.  She said it is feeling a little bit better and wants to wait.   I told her that was fine and that I would close this referral that was sent to [place].  I told her to let her PCP know if/when she has any further problems with this so that they could send a new referral and then we would get her scheduled.

## 2018-05-31 ENCOUNTER — Other Ambulatory Visit: Payer: Self-pay

## 2018-05-31 ENCOUNTER — Emergency Department (HOSPITAL_COMMUNITY)
Admission: EM | Admit: 2018-05-31 | Discharge: 2018-05-31 | Disposition: A | Discharge status: Home / Self Care | Payer: Medicare HMO | Attending: Emergency Medicine | Admitting: Emergency Medicine

## 2018-05-31 ENCOUNTER — Encounter (HOSPITAL_COMMUNITY): Payer: Self-pay

## 2018-05-31 DIAGNOSIS — H10023 Other mucopurulent conjunctivitis, bilateral: Secondary | ICD-10-CM | POA: Insufficient documentation

## 2018-05-31 DIAGNOSIS — R0981 Nasal congestion: Secondary | ICD-10-CM | POA: Diagnosis present

## 2018-05-31 DIAGNOSIS — H1033 Unspecified acute conjunctivitis, bilateral: Secondary | ICD-10-CM

## 2018-05-31 DIAGNOSIS — Z79899 Other long term (current) drug therapy: Secondary | ICD-10-CM | POA: Insufficient documentation

## 2018-05-31 DIAGNOSIS — J0140 Acute pansinusitis, unspecified: Secondary | ICD-10-CM | POA: Diagnosis not present

## 2018-05-31 MED ORDER — POLYMYXIN B-TRIMETHOPRIM 10000-0.1 UNIT/ML-% OP SOLN: 1.0000 [drp] | Freq: Four times a day (QID) | OPHTHALMIC | 0 refills | Status: AC

## 2018-05-31 MED ORDER — AMOXICILLIN-POT CLAVULANATE 875-125 MG PO TABS: 1.0000 | ORAL_TABLET | Freq: Two times a day (BID) | ORAL | 0 refills | Status: AC

## 2018-05-31 NOTE — ED Provider Notes (Signed)
[facility] EMERGENCY DEPARTMENT Provider Note   CSN: [id number] Arrival date & time: 05/31/18  4098     History   Chief Complaint Chief Complaint  Patient presents with  . Nasal Congestion    HPI Brittany Arellano is a 52 y.o. female presents with nasal congestion, sinus pressure, and a productive cough onset 1.5 weeks ago. Patient reports symptoms are constant and have are only partial relieved with benadryl, decongestants, and ibuprofen. Patient reports associated right eye erythema onset yesterday. Patient reports this morning she has bilateral eye erythema and mild itching. Patient reports purulent material around eyes and denies pain or vision changes. Patient is unsure if she has had a fever.  Patient denies taking any medicine today. 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 Patient denies shortness of breath or chest pain. Patient denies abdominal pain, nausea, or vomiting.  HPI  Past Medical History:  Diagnosis Date  . Epilepsia [facility])     Patient Active Problem List   Diagnosis Date Noted  . Encounter for screening colonoscopy 09/03/2017  . Taking multiple medications for chronic disease 09/03/2017    Past Surgical History:  Procedure Laterality Date  . ABLATION    . COLONOSCOPY WITH PROPOFOL N/A 09/29/2017   Procedure: COLONOSCOPY WITH PROPOFOL;  Surgeon: Corbin Ade, MD;  Location: [facility] ENDO SUITE;  Service: Endoscopy;  Laterality: N/A;  2:15pm  . POLYPECTOMY  09/29/2017   Procedure: POLYPECTOMY;  Surgeon: Corbin Ade, MD;  Location: [facility] ENDO SUITE;  Service: Endoscopy;;  ascending colon polyp, descending colon polyp, recto sigmoid colon polyps x3  . TUBAL LIGATION       OB History    Gravida  2   Para  1   Term  1   Preterm      AB  1   Living  1     SAB  1   TAB      Ectopic      Multiple      Live Births               Home Medications    Prior to Admission medications   Medication Sig Start Date End Date Taking? Authorizing  Provider  ibuprofen (ADVIL,MOTRIN) 600 MG tablet Take 600 mg by mouth every 6 (six) hours as needed.   Yes [provider]  lamoTRIgine (LAMICTAL) 150 MG tablet Take 150 mg by mouth at bedtime.    Yes [provider]  Omega-3 Fatty Acids (FISH OIL) 1000 MG CAPS Take 1,000 mg by mouth daily.    Yes [provider]  omeprazole (PRILOSEC) 20 MG capsule Take 20 mg by mouth daily as needed (acid reflux).    Yes [provider]  Triamcinolone Acetonide (NASACORT ALLERGY 24HR NA) Place 1 spray into the nose daily.   Yes [provider]  zonisamide (ZONEGRAN) 100 MG capsule Take 100 mg by mouth at bedtime.    Yes [provider]  amoxicillin-clavulanate (AUGMENTIN) 875-125 MG tablet Take 1 tablet by mouth 2 (two) times daily for 14 days. 05/31/18 06/14/18  Carlyle Basques P, PA-C  trimethoprim-polymyxin b (POLYTRIM) ophthalmic solution Place 1 drop into both eyes every 6 (six) hours for 7 days. 05/31/18 06/07/18  Leretha Dykes, PA-C    Family History Family History  Problem Relation Age of Onset  . Cancer Mother        ovarian in the 47s  . Cervical cancer Maternal Grandmother   . Lung cancer Other  3 paternal aunts  . Cancer Paternal Uncle        unknown type  . Colon cancer Neg Hx     Social History Social History   Tobacco Use  . Smoking status: Current Every Day Smoker    Packs/day: 1.00    Years: 35.00    Pack years: 35.00    Types: Cigarettes  . Smokeless tobacco: Never Used  Substance Use Topics  . Alcohol use: Yes    Comment: rare  . Drug use: No     Allergies   Patient has no known allergies.   Review of Systems Review of Systems  Constitutional: Positive for fever. Negative for activity change, appetite change and fatigue.  HENT: Positive for congestion, rhinorrhea and sinus pressure. Negative for ear pain, postnasal drip and sore throat.   Eyes: Positive for discharge, redness and itching. Negative for  photophobia, pain and visual disturbance.  Respiratory: Positive for cough. Negative for shortness of breath.   Cardiovascular: Negative for chest pain.  Gastrointestinal: Negative for abdominal pain, diarrhea, nausea and vomiting.  Musculoskeletal: Negative for myalgias.  Skin: Negative for rash.  Allergic/Immunologic: Negative for environmental allergies and immunocompromised state.  Neurological: Negative for dizziness, weakness and headaches.     Physical Exam Updated Vital Signs BP (!) 145/72 (BP Location: Right Arm)   Pulse (!) 102   Temp 97.6 F (36.4 C) (Oral)   Resp 16   Ht 5\' 2"  (1.575 m)   Wt 81.6 kg   SpO2 97%   BMI 32.92 kg/m   Physical Exam Vitals signs and nursing note reviewed.  Constitutional:      General: She is not in acute distress.    Appearance: She is well-developed. She is not diaphoretic.  HENT:     Head: Normocephalic and atraumatic.     Right Ear: Tympanic membrane, ear canal and external ear normal. No middle ear effusion.     Left Ear: Tympanic membrane, ear canal and external ear normal.  No middle ear effusion.     Nose: Mucosal edema, congestion and rhinorrhea present.     Right Sinus: Maxillary sinus tenderness and frontal sinus tenderness present.     Left Sinus: Maxillary sinus tenderness and frontal sinus tenderness present.     Mouth/Throat:     Mouth: Mucous membranes are moist.     Pharynx: Uvula midline. Posterior oropharyngeal erythema present. No oropharyngeal exudate.  Eyes:     General: Vision grossly intact.        Right eye: Discharge present.        Left eye: Discharge present.    Extraocular Movements: Extraocular movements intact.     Conjunctiva/sclera:     Right eye: Right conjunctiva is injected.     Left eye: Left conjunctiva is injected.     Comments: Erythema and purulent material noted on bilateral eyes consistent conjunctivitis.   Neck:     Musculoskeletal: Normal range of motion and neck supple.    Cardiovascular:     Rate and Rhythm: Normal rate and regular rhythm.     Heart sounds: Normal heart sounds. No murmur. No friction rub. No gallop.   Pulmonary:     Effort: Pulmonary effort is normal. No respiratory distress.     Breath sounds: Normal breath sounds. No wheezing or rales.  Abdominal:     Palpations: Abdomen is soft.     Tenderness: There is no abdominal tenderness.  Musculoskeletal: Normal range of motion.  Lymphadenopathy:  Cervical: No cervical adenopathy.  Skin:    General: Skin is warm.     Findings: No rash.  Neurological:     Mental Status: She is alert.      ED Treatments / Results  Labs (all labs ordered are listed, but only abnormal results are displayed) Labs Reviewed - No data to display  EKG None  Radiology No results found.  Procedures Procedures (including critical care time)  Medications Ordered in ED Medications - No data to display   Initial Impression / Assessment and Plan / ED Course  I have reviewed the triage vital signs and the nursing notes.  Pertinent labs & imaging results that were available during my care of the patient were reviewed by me and considered in my medical decision making (see chart for details).    Patients symptoms are consistent with sinusitis, likely bacterial etiology due to length of symptoms. Patient also presents with bacterial conjunctivitis. Pt will be discharged with antibiotics, eye drops, and encouraged to continue symptomatic treatment.  Verbalizes understanding and is agreeable with plan. Pt is hemodynamically stable & in NAD prior to dc.   Final Clinical Impressions(s) / ED Diagnoses   Final diagnoses:  Acute non-recurrent pansinusitis  Acute bacterial conjunctivitis of both eyes    ED Discharge Orders         Ordered    amoxicillin-clavulanate (AUGMENTIN) 875-125 MG tablet  2 times daily     05/31/18 1141    trimethoprim-polymyxin b (POLYTRIM) ophthalmic solution  Every 6 hours      05/31/18 1141           Carlyle Basques [place], [place] 05/31/18 1143    Vanetta Mulders, MD 06/03/18 1052

## 2018-05-31 NOTE — ED Triage Notes (Signed)
Pt reports nasal congestion for about a week with some coughing. Blowing out green mucous. Woke up this swelling and redness to eyes. Reports that bilateral eyes were crusted over

## 2018-05-31 NOTE — Discharge Instructions (Addendum)
At this time you appear to have a sinusitis and conjunctivitis. Treatment of symptoms and antibiotics is the best plan. This includes tylenol or motrin for aches and pains, gargling with salt water for sore throat, increased fluid intake, especially hot drinks to soothe the throat.  Over the counter medications that include decongestants and cough suppressants may also help.  Expect to have symptoms for 5-10 days.  Return to the ER for worsening condition or new concerning symptoms.  1. Medications: Augmentin, ofloxacin eye drops, and usual home medications 2. Treatment: rest, drink plenty of fluids, take tylenol or ibuprofen for fever control 3. Follow Up: Please follow up with your primary doctor in 3 days for discussion of your diagnoses and further evaluation after today's visit; if you do not have a primary care doctor use the resource guide provided to find one; Return to the ER for high fevers, difficulty breathing or other concerning symptoms  Continue to stay well-hydrated. Gargle warm salt water and spit it out for sore throat. Take benadryl or other antihistamine to decrease secretions and for watery itchy eyes. Continued to alternate between Tylenol and ibuprofen for pain. Follow up with your primary care doctor in 5-7 days or referral for urgent care for recheck of ongoing symptoms however return to emergency department for emergent changing or worsening of symptoms.   Read the instructions below on reasons to return to the emergency department and to learn more about your diagnosis.  Use over the counter medications for symptomatic relief as we discussed (mucinex as a decongestant, Tylenol for fever/pain, Motrin/Ibuprofen for muscle aches). If prescribed a cough suppressant during your visit, do not operate heavy machinery with in 5 hours of taking this medication. Follow up with your primary care doctor in 4 days if your symptoms persist.  Your more than welcome to return to the emergency  department if symptoms worsen or become concerning.  Return to Emergency Deparment if you experience: Severe headache or stiff neck. Uncontrolled vomiting. Lightheadedness, feeling faint, or if you pass out. Problems breathing or shortness of breath. Weakness or inability to walk. Any other concerning symptoms or concerns. If not improving over 7 days or if feeling worse at any time.  RESOURCE GUIDE  Dental Problems  Patients with Medicaid: [facility]                                            [facility] [phone]                                                                   [phone] [address] Phone:  [phone]                                                                             Phone:  [phone]  If unable to pay or uninsured,  contact:  [facility] or [facility]. to become qualified for the adult dental clinic.  Chronic Pain Problems Contact [facility]  [phone] Patients need to be referred by their primary care doctor.  Insufficient Money for Medicine Contact [employer]:  call "211" or [facility] (541)322-7889.  No Primary Care Doctor Call [facility]  [phone] Other agencies that provide inexpensive medical care    [facility]  [phone]    [facility]  [phone]    [facility]  [phone]    [facility]  [phone]    [facility]  [phone]    [facility]  [phone]  Psychological Services [facility]  [phone] [facility]  [phone] [facility]   [phone] (emergency services 7858873703)  Abuse/Neglect [facility] Child Abuse Hotline 820-776-2148 [facility] Child Abuse Hotline (707) 545-1901 (After Hours)  Emergency Shelter [facility] ([phone]  Maternity Homes [facility] [place] [facility] 215-164-0527 [facility] (726) [phone]  MRSA Hotline #:   2531906418  [facility] Resources  [facility]     [employer]                          [facility] Dept. [address] [place]                        [address]          [address]  Blondell Reveal Phone:  [phone]                                     Phone:  [phone]                   Phone:  [phone]  [facility] Mental Health Phone:  [phone]  [facility] Child Abuse Hotline 253-379-1562 8317962687 (After Hours)   If you develop symptoms of Shortness of Breath, Chest Pain, Swelling of lips, mouth or tongue or if your condition becomes worse with any new symptoms, see your doctor or return to the Emergency Department for immediate care. Emergency services are not intended to be a substitute for comprehensive medical attention.  Please contact your doctor for follow up if not improving as expected.   Call your doctor in 5-7 days or as directed if there is no improvement.   Community Resources: *IF YOU ARE IN IMMEDIATE DANGER CALL 911!  Abuse/Neglect:  Family Services Crisis Hotline [facility]): (720)427-5102 [facility]): 580 692 9120  After hours, holidays and weekends: 308-060-6208 National Domestic Violence Hotline: 5145437311  Mental Health: [facility]: Drucie Ip: [phone]  Health Clinics:  [facility]): [phone] Monday - Friday 8 AM -  9 PM, Saturday and Sunday 10 AM - 9 PM  [facility] [place]: [phone] Monday - Friday 8 AM - 5 PM  [facility]  [address]: [phone] Monday- Friday 8:30 AM - 5:30 PM, Sat 9 AM - 1 PM  24 [facility] [address]: [phone] [facility] [address]: [phone] [facility] [address]:  [phone]  24 [facility]: [address] [phone]  Cultures: If culture results are positive, we will notify you if a change in treatment is necessary.  LABORATORY TESTS:         If you had any labs drawn in the ED that have not resulted by the time you are discharged home, we will review these lab results and the treatment given to you.  If there is any further treatment or notification needed, we will contact you by phone, or letter.  "PLEASE ENSURE THAT YOU HAVE GIVEN US YOUR CURRENT WORKING PHONE NUMBER AND YOUR CURRENT ADDRESS, so that we can contact you if needed."  RADIOLOGY TESTS:  If the referred physician wants todays x-rays, please call the hospitals Radiology Department the day before your doctors appointment. [place]     [phone] [place]   [phone] [place]     [phone]  Our doctors and staff appreciate your choosing [place] for your emergency medical care needs. We are here to serve you.

## 2019-01-21 ENCOUNTER — Encounter: Payer: Medicare HMO | Admitting: Obstetrics and Gynecology

## 2019-02-02 ENCOUNTER — Encounter: Payer: Medicare HMO | Admitting: Obstetrics & Gynecology

## 2019-03-19 ENCOUNTER — Other Ambulatory Visit: Payer: Self-pay

## 2019-03-19 ENCOUNTER — Other Ambulatory Visit: Payer: Self-pay | Admitting: Obstetrics & Gynecology

## 2019-03-19 ENCOUNTER — Ambulatory Visit (INDEPENDENT_AMBULATORY_CARE_PROVIDER_SITE_OTHER): Payer: Medicare HMO | Admitting: Obstetrics & Gynecology

## 2019-03-19 ENCOUNTER — Encounter: Payer: Self-pay | Admitting: Obstetrics & Gynecology

## 2019-03-19 VITALS — BP 128/76 | HR 87 | Ht 62.0 in | Wt 177.0 lb

## 2019-03-19 DIAGNOSIS — N3946 Mixed incontinence: Secondary | ICD-10-CM

## 2019-03-19 DIAGNOSIS — N3281 Overactive bladder: Secondary | ICD-10-CM | POA: Diagnosis not present

## 2019-03-19 MED ORDER — SOLIFENACIN SUCCINATE 10 MG PO TABS: 10.0000 mg | ORAL_TABLET | Freq: Every day | ORAL | 11 refills | Status: DC

## 2019-03-19 NOTE — Progress Notes (Signed)
Chief Complaint  Patient presents with   Bladder Prolapse      53 y.o. G2P1011 No LMP recorded. Patient has had an ablation. The current method of family planning is tubal ligation.  Outpatient Encounter Medications as of 03/19/2019  Medication Sig Note   ibuprofen (ADVIL,MOTRIN) 600 MG tablet Take 600 mg by mouth every 6 (six) hours as needed.    lamoTRIgine (LAMICTAL) 150 MG tablet Take 150 mg by mouth at bedtime.     omeprazole (PRILOSEC) 20 MG capsule Take 20 mg by mouth daily as needed (acid reflux).     Triamcinolone Acetonide (NASACORT ALLERGY 24HR NA) Place 1 spray into the nose daily.    zonisamide (ZONEGRAN) 100 MG capsule Take 100 mg by mouth at bedtime.     Omega-3 Fatty Acids (FISH OIL) 1000 MG CAPS Take 1,000 mg by mouth daily.  09/18/2017: Hasnt started   solifenacin (VESICARE) 10 MG tablet Take 1 tablet (10 mg total) by mouth daily.    No facility-administered encounter medications on file as of 03/19/2019.     Subjective Pt is seen as a [facility] for POP and urinary incontinence Pt wears a pad all the time  Loses urine multiple times per day, generally small amounts No bleeding s/p ablation Also loses with coughing and sneezing Smokes 15 cigarettes per day No pelvic pressure Past Medical History:  Diagnosis Date   Epilepsia [facility])     Past Surgical History:  Procedure Laterality Date   ABLATION     COLONOSCOPY WITH PROPOFOL N/A 09/29/2017   Procedure: COLONOSCOPY WITH PROPOFOL;  Surgeon: Daneil Dolin, MD;  [facility];  Service: Endoscopy;  Laterality: N/A;  2:15pm   POLYPECTOMY  09/29/2017   Procedure: POLYPECTOMY;  Surgeon: Daneil Dolin, MD;  [facility];  Service: Endoscopy;;  ascending colon polyp, descending colon polyp, recto sigmoid colon polyps x3   TUBAL LIGATION      OB History    Gravida  2   Para  1   Term  1   Preterm      AB  1   Living  1     SAB  1   TAB      Ectopic        Multiple      Live Births              No Known Allergies  Social History   Socioeconomic History   Marital status: Divorced    Spouse name: Not on file   Number of children: 1   Years of education: Not on file   Highest education level: Not on file  Occupational History   Not on file  Social Needs   Financial resource strain: Not on file   Food insecurity    Worry: Not on file    Inability: Not on file   Transportation needs    Medical: Not on file    Non-medical: Not on file  Tobacco Use   Smoking status: Current Every Day Smoker    Packs/day: 1.00    Years: 35.00    Pack years: 35.00    Types: Cigarettes   Smokeless tobacco: Never Used  Substance and Sexual Activity   Alcohol use: Yes    Comment: rare   Drug use: No   Sexual activity: Yes    Birth control/protection: Surgical  Lifestyle   Physical activity    Days per week: Not on  file    Minutes per session: Not on file   Stress: Not on file  Relationships   Social connections    Talks on phone: Not on file    Gets together: Not on file    Attends religious service: Not on file    Active member of club or organization: Not on file    Attends meetings of clubs or organizations: Not on file    Relationship status: Not on file  Other Topics Concern   Not on file  Social History Narrative   Not on file    Family History  Problem Relation Age of Onset   Cancer Mother        ovarian in the 1980s   Cervical cancer Maternal Grandmother    Lung cancer Other        3 paternal aunts   Cancer Paternal Uncle        unknown type   Colon cancer Neg Hx     Medications:       Current Outpatient Medications:    ibuprofen (ADVIL,MOTRIN) 600 MG tablet, Take 600 mg by mouth every 6 (six) hours as needed., Disp: , Rfl:    lamoTRIgine (LAMICTAL) 150 MG tablet, Take 150 mg by mouth at bedtime. , Disp: , Rfl:    omeprazole (PRILOSEC) 20 MG capsule, Take 20 mg by mouth daily as  needed (acid reflux). , Disp: , Rfl:    Triamcinolone Acetonide (NASACORT ALLERGY 24HR NA), Place 1 spray into the nose daily., Disp: , Rfl:    zonisamide (ZONEGRAN) 100 MG capsule, Take 100 mg by mouth at bedtime. , Disp: , Rfl:    Omega-3 Fatty Acids (FISH OIL) 1000 MG CAPS, Take 1,000 mg by mouth daily. , Disp: , Rfl:    solifenacin (VESICARE) 10 MG tablet, Take 1 tablet (10 mg total) by mouth daily., Disp: 30 tablet, Rfl: 11  Objective Blood pressure 128/76, pulse 87, height 5\' 2"  (1.575 m), weight 177 lb (80.3 kg).  General WDWN female NAD Vulva:  normal appearing vulva with no masses, tenderness or lesions Vagina:  normal mucosa, no discharge Cervix:  Normal no lesions Uterus:  normal size, contour, position, consistency, mobility, non-tender Adnexa: ovaries:present,  normal adnexa in size, nontender and no masses No appreciable POP at all anteriro apex or posterior Not a pessary candidate  Pertinent ROS No burning with urination, frequency or urgency No nausea, vomiting or diarrhea Nor fever chills or other constitutional symptoms   Labs or studies     Impression Diagnoses this Encounter::   ICD-10-CM   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 Mixed incontinence  N39.46     Established relevant diagnosis(es):   Plan/Recommendations: Meds ordered this encounter  Medications   solifenacin (VESICARE) 10 MG tablet    Sig: Take 1 tablet (10 mg total) by mouth daily.    Dispense:  30 tablet    Refill:  11    Labs or Scans Ordered: No orders of the defined types were placed in this encounter.   Management:: >vesicare for management of OAB >No POP is noted >smoking cessation will be beneficial as well  Follow up Return in about 6 weeks (around 04/30/2019) for Follow up, with Dr 05/02/2019.     All questions were answered.

## 2019-03-23 ENCOUNTER — Telehealth: Payer: Self-pay | Admitting: Obstetrics & Gynecology

## 2019-03-23 ENCOUNTER — Telehealth: Payer: Self-pay | Admitting: *Deleted

## 2019-03-23 MED ORDER — MIRABEGRON ER 25 MG PO TB24: 25.0000 mg | ORAL_TABLET | Freq: Every day | ORAL | 11 refills | Status: DC

## 2019-03-23 NOTE — Telephone Encounter (Signed)
Can you resend prescription for Myrbetriq as there are no administration instructions on the script. Thanks.

## 2019-04-01 ENCOUNTER — Other Ambulatory Visit: Payer: Self-pay | Admitting: *Deleted

## 2019-04-01 MED ORDER — MIRABEGRON ER 25 MG PO TB24: 25.0000 mg | ORAL_TABLET | Freq: Every day | ORAL | 11 refills | Status: DC

## 2019-04-08 ENCOUNTER — Emergency Department (HOSPITAL_COMMUNITY)
Admission: EM | Admit: 2019-04-08 | Discharge: 2019-04-08 | Disposition: A | Discharge status: Home / Self Care | Payer: Medicare HMO | Attending: Emergency Medicine | Admitting: Emergency Medicine

## 2019-04-08 ENCOUNTER — Emergency Department (HOSPITAL_COMMUNITY): Payer: Medicare HMO

## 2019-04-08 ENCOUNTER — Other Ambulatory Visit: Payer: Self-pay

## 2019-04-08 ENCOUNTER — Encounter (HOSPITAL_COMMUNITY): Payer: Self-pay

## 2019-04-08 DIAGNOSIS — R519 Headache, unspecified: Secondary | ICD-10-CM | POA: Insufficient documentation

## 2019-04-08 DIAGNOSIS — R42 Dizziness and giddiness: Secondary | ICD-10-CM | POA: Diagnosis not present

## 2019-04-08 DIAGNOSIS — Z79899 Other long term (current) drug therapy: Secondary | ICD-10-CM | POA: Diagnosis not present

## 2019-04-08 DIAGNOSIS — F17210 Nicotine dependence, cigarettes, uncomplicated: Secondary | ICD-10-CM | POA: Diagnosis not present

## 2019-04-08 LAB — CBC WITH DIFFERENTIAL/PLATELET
Abs Immature Granulocytes: 0.04 10*3/uL (ref 0.00–0.07)
Basophils Absolute: 0.1 10*3/uL (ref 0.0–0.1)
Basophils Relative: 1 %
Eosinophils Absolute: 0.1 10*3/uL (ref 0.0–0.5)
Eosinophils Relative: 2 %
HCT: 49.6 % — ABNORMAL HIGH (ref 36.0–46.0)
Hemoglobin: 15.2 g/dL — ABNORMAL HIGH (ref 12.0–15.0)
Immature Granulocytes: 0 %
Lymphocytes Relative: 32 %
Lymphs Abs: 3 10*3/uL (ref 0.7–4.0)
MCH: 27.3 pg (ref 26.0–34.0)
MCHC: 30.6 g/dL (ref 30.0–36.0)
MCV: 89.2 fL (ref 80.0–100.0)
Monocytes Absolute: 0.6 10*3/uL (ref 0.1–1.0)
Monocytes Relative: 6 %
Neutro Abs: 5.6 10*3/uL (ref 1.7–7.7)
Neutrophils Relative %: 59 %
Platelets: 333 10*3/uL (ref 150–400)
RBC: 5.56 MIL/uL — ABNORMAL HIGH (ref 3.87–5.11)
RDW: 14.3 % (ref 11.5–15.5)
WBC: 9.4 10*3/uL (ref 4.0–10.5)
nRBC: 0 % (ref 0.0–0.2)

## 2019-04-08 LAB — BASIC METABOLIC PANEL
Anion gap: 11 (ref 5–15)
BUN: 12 mg/dL (ref 6–20)
CO2: 23 mmol/L (ref 22–32)
Calcium: 9 mg/dL (ref 8.9–10.3)
Chloride: 108 mmol/L (ref 98–111)
Creatinine, Ser: 0.87 mg/dL (ref 0.44–1.00)
GFR calc Af Amer: >60 mL/min (ref >60)
GFR calc non Af Amer: >60 mL/min (ref >60)
Glucose, Bld: 107 mg/dL — ABNORMAL HIGH (ref 70–99)
Potassium: 3.5 mmol/L (ref 3.5–5.1)
Sodium: 142 mmol/L (ref 135–145)

## 2019-04-08 MED ORDER — KETOROLAC TROMETHAMINE 30 MG/ML IJ SOLN
30.0000 mg | Freq: Once | INTRAMUSCULAR | Status: AC
Start: 2019-04-08 — End: 2019-04-08
  Administered 2019-04-08: 30 mg via INTRAVENOUS
  Filled 2019-04-08: qty 1

## 2019-04-08 MED ORDER — NAPROXEN 500 MG PO TABS: 500.0000 mg | ORAL_TABLET | Freq: Two times a day (BID) | ORAL | 0 refills | Status: DC

## 2019-04-08 MED ORDER — SODIUM CHLORIDE 0.9 % IV BOLUS (SEPSIS)
1000.0000 mL | Freq: Once | INTRAVENOUS | Status: AC
  Administered 2019-04-08: 1000 mL via INTRAVENOUS

## 2019-04-08 MED ORDER — MECLIZINE HCL 12.5 MG PO TABS
50.0000 mg | ORAL_TABLET | Freq: Once | ORAL | Status: AC
  Administered 2019-04-08: 50 mg via ORAL
  Filled 2019-04-08: qty 4

## 2019-04-08 MED ORDER — MECLIZINE HCL 25 MG PO TABS: 25.0000 mg | ORAL_TABLET | Freq: Three times a day (TID) | ORAL | 0 refills | Status: DC | PRN

## 2019-04-08 NOTE — ED Triage Notes (Addendum)
Pt is having a headache that is causing pressure behind her eyes. States more pain with movement. Has had a chronic cough for the last 3 months. Denies history of migraines. Started a medication for overactive bladder and started having the headache once medication was started.

## 2019-04-08 NOTE — Discharge Instructions (Signed)
You are seen today for headache and dizziness.  It is possible this could be coming from your new medication but it is also possible this could be coming from your sinuses as well.  Your head CT showed no other emergent or acute findings.  There is no pneumonia or acute findings on your chest x-ray and your lab work looks very good.  Please follow-up with your primary care doctor or return to the emergency department if you have any new or worsening symptoms.

## 2019-04-08 NOTE — ED Notes (Signed)
Pt to ct 

## 2019-04-08 NOTE — ED Notes (Signed)
Discharge delayed, IVF not finished

## 2019-04-08 NOTE — ED Provider Notes (Signed)
[facility] EMERGENCY DEPARTMENT Provider Note   CSN: [id number] Arrival date & time: 04/08/19  1543     History   Chief Complaint Chief Complaint  Patient presents with  . Headache    HPI Brittany Arellano is a 53 y.o. female.     Patient is a 53 year old female with past medical history of epilepsy and overactive bladder presented to the emergency department for headache and dizziness.  Patient reports that this started about 1-1/2 weeks ago.  Reports that she is having intermittent dizziness which is worse in the afternoons with a headache that is entirely frontal and occipital.  Denies any exacerbating or relieving factors.  Reports that she also started a new medication this month for overactive bladder and wonders if this could be causing the headache.  Denies any nausea, vomiting, chest pain, shortness of breath.  She does have chronic issues with sinus infection as well as bronchitis.  Reports a chronic cough for the last 3 months as well and has been on 2 courses of antibiotics and prednisone with minimal relief.  Denies any fever, chills, vision changes, numbness, tingling, syncope, recent seizure, shortness of breath.     Past Medical History:  Diagnosis Date  . Epilepsia [facility])     Patient Active Problem List   Diagnosis Date Noted  . Encounter for screening colonoscopy 09/03/2017  . Taking multiple medications for chronic disease 09/03/2017    Past Surgical History:  Procedure Laterality Date  . ABLATION    . COLONOSCOPY WITH PROPOFOL N/A 09/29/2017   Procedure: COLONOSCOPY WITH PROPOFOL;  Surgeon: Corbin Ade, MD;  [facility];  Service: Endoscopy;  Laterality: N/A;  2:15pm  . POLYPECTOMY  09/29/2017   Procedure: POLYPECTOMY;  Surgeon: Corbin Ade, MD;  Location: [facility];  Service: Endoscopy;;  ascending colon polyp, descending colon polyp, recto sigmoid colon polyps x3  . TUBAL LIGATION       OB History    Gravida  2   Para  1   Term  1   Preterm      AB  1   Living  1     SAB  1   TAB      Ectopic      Multiple      Live Births               Home Medications    Prior to Admission medications   Medication Sig Start Date End Date Taking? Authorizing Provider  ibuprofen (ADVIL,MOTRIN) 600 MG tablet Take 600 mg by mouth every 6 (six) hours as needed.    [provider]  lamoTRIgine (LAMICTAL) 150 MG tablet Take 150 mg by mouth at bedtime.     [provider]  meclizine (ANTIVERT) 25 MG tablet Take 1 tablet (25 mg total) by mouth 3 (three) times daily as needed for dizziness. 04/08/19   Arlyn Dunning, PA-C  mirabegron ER (MYRBETRIQ) 25 MG TB24 tablet Take 1 tablet (25 mg total) by mouth at bedtime. 04/01/19   Lazaro Arms, MD  naproxen (NAPROSYN) 500 MG tablet Take 1 tablet (500 mg total) by mouth 2 (two) times daily. 04/08/19   Arlyn Dunning, PA-C  Omega-3 Fatty Acids (FISH OIL) 1000 MG CAPS Take 1,000 mg by mouth daily.     [provider]  omeprazole (PRILOSEC) 20 MG capsule Take 20 mg by mouth daily as needed (acid reflux).     [provider]  Triamcinolone  Acetonide (NASACORT ALLERGY 24HR NA) Place 1 spray into the nose daily.    [provider]  zonisamide (ZONEGRAN) 100 MG capsule Take 100 mg by mouth at bedtime.     [provider]    Family History Family History  Problem Relation Age of Onset  . Cancer Mother        ovarian in the 5s  . Cervical cancer Maternal Grandmother   . Lung cancer Other        3 paternal aunts  . Cancer Paternal Uncle        unknown type  . Colon cancer Neg Hx     Social History Social History   Tobacco Use  . Smoking status: Current Every Day Smoker    Packs/day: 1.00    Years: 35.00    Pack years: 35.00    Types: Cigarettes  . Smokeless tobacco: Never Used  Substance Use Topics  . Alcohol use: Yes    Comment: rare  . Drug use: No     Allergies   Patient has no known  allergies.   Review of Systems Review of Systems  Constitutional: Negative for appetite change, chills, diaphoresis, fatigue and fever.  HENT: Positive for congestion. Negative for ear pain, facial swelling, postnasal drip, rhinorrhea, sinus pressure, sinus pain, sneezing, sore throat and trouble swallowing.   Eyes: Negative for photophobia, pain, redness and visual disturbance.  Respiratory: Positive for cough. Negative for shortness of breath.   Cardiovascular: Negative for chest pain, palpitations and leg swelling.  Gastrointestinal: Negative for abdominal pain, nausea and vomiting.  Genitourinary: Negative for dysuria, flank pain and pelvic pain.  Musculoskeletal: Negative for arthralgias, back pain, neck pain and neck stiffness.  Skin: Negative for rash.  Neurological: Positive for dizziness and headaches. Negative for seizures, syncope, facial asymmetry, weakness, light-headedness and numbness.  Psychiatric/Behavioral: Negative for confusion.     Physical Exam Updated Vital Signs BP 133/69 (BP Location: Right Arm)   Pulse 86   Temp 98 F (36.7 C) (Oral)   Resp 14   Ht 5\' 2"  (1.575 m)   Wt 77.1 kg   SpO2 98%   BMI 31.09 kg/m   Physical Exam Vitals signs and nursing note reviewed.  Constitutional:      Appearance: Normal appearance.  HENT:     Head: Normocephalic and atraumatic.     Mouth/Throat:     Mouth: Mucous membranes are moist.  Eyes:     General: No visual field deficit or scleral icterus.    Extraocular Movements: Extraocular movements intact.     Right eye: Normal extraocular motion and no nystagmus.     Left eye: Normal extraocular motion and no nystagmus.     Conjunctiva/sclera: Conjunctivae normal.     Pupils: Pupils are equal, round, and reactive to light. Pupils are equal.  Neck:     Musculoskeletal: Normal range of motion and neck supple.  Cardiovascular:     Rate and Rhythm: Normal rate and regular rhythm.     Heart sounds: Normal heart sounds.   Pulmonary:     Effort: Pulmonary effort is normal.     Breath sounds: Normal breath sounds.  Abdominal:     General: Bowel sounds are normal.     Palpations: Abdomen is soft.  Musculoskeletal: Normal range of motion.  Skin:    General: Skin is dry.  Neurological:     Mental Status: She is alert and oriented to person, place, and time.     GCS:  GCS eye subscore is 4. GCS verbal subscore is 5. GCS motor subscore is 6.     Cranial Nerves: No cranial nerve deficit, dysarthria or facial asymmetry.     Sensory: No sensory deficit.     Motor: No weakness.     Coordination: Romberg sign negative. Coordination normal.     Gait: Gait normal.  Psychiatric:        Mood and Affect: Mood normal.        Speech: Speech normal.        Behavior: Behavior normal.      ED Treatments / Results  Labs (all labs ordered are listed, but only abnormal results are displayed) Labs Reviewed  BASIC METABOLIC PANEL - Abnormal; Notable for the following components:      Result Value   Glucose, Bld 107 (*)    All other components within normal limits  CBC WITH DIFFERENTIAL/PLATELET - Abnormal; Notable for the following components:   RBC 5.56 (*)    Hemoglobin 15.2 (*)    HCT 49.6 (*)    All other components within normal limits    EKG EKG Interpretation  Date/Time:  Thursday April 08 2019 20:36:49 EDT Ventricular Rate:  70 PR Interval:    QRS Duration: 104 QT Interval:  422 QTC Calculation: 456 R Axis:   177 Text Interpretation:  Sinus rhythm Right axis deviation Low voltage, precordial leads Abnormal inferior Q waves No previous ECGs available Confirmed by Vanetta MuldersZackowski, Scott [phone]([id number]) on 04/08/2019 8:38:33 PM   Radiology Dg Chest 2 View  Result Date: 04/08/2019 CLINICAL DATA:  53 year old female with history of dizziness and ongoing cough for the past 3 weeks. EXAM: CHEST - 2 VIEW COMPARISON:  No priors. FINDINGS: Lung volumes are normal. No consolidative airspace disease. No pleural  effusions. No pneumothorax. No pulmonary nodule or mass noted. Pulmonary vasculature and the cardiomediastinal silhouette are within normal limits. IMPRESSION: No radiographic evidence of acute cardiopulmonary disease. Electronically Signed   By: Trudie Reedaniel  Entrikin M.D.   On: 04/08/2019 20:56   Ct Head Wo Contrast  Result Date: 04/08/2019 CLINICAL DATA:  Headaches EXAM: CT HEAD WITHOUT CONTRAST TECHNIQUE: Contiguous axial images were obtained from the base of the skull through the vertex without intravenous contrast. COMPARISON:  None. FINDINGS: Brain: No evidence of acute infarction, hemorrhage, hydrocephalus, extra-axial collection or mass lesion/mass effect. Vascular: No hyperdense vessel or unexpected calcification. Skull: Normal. Negative for fracture or focal lesion. Sinuses/Orbits: The mastoid air cells are clear. There is asymmetric opacification of the left maxillary sinus and left side of the sphenoid sinus Other: None. IMPRESSION: 1. No acute intracranial abnormalities. 2. Left maxillary sinus and sphenoid sinus opacification. Electronically Signed   By: Signa Kellaylor  Stroud M.D.   On: 04/08/2019 20:02    Procedures Procedures (including critical care time)  Medications Ordered in ED Medications  ketorolac (TORADOL) 30 MG/ML injection 30 mg (30 mg Intravenous Given 04/08/19 2002)  sodium chloride 0.9 % bolus 1,000 mL (1,000 mLs Intravenous New Bag/Given 04/08/19 2002)  meclizine (ANTIVERT) tablet 50 mg (50 mg Oral Given 04/08/19 2002)     Initial Impression / Assessment and Plan / ED Course  I have reviewed the triage vital signs and the nursing notes.  Pertinent labs & imaging results that were available during my care of the patient were reviewed by me and considered in my medical decision making (see chart for details).  Clinical Course as of Apr 07 2104  Thu Apr 08, 2019  [id number] 53 year old patient presenting with  new onset of new headache 1-1/2-week ago with dizziness.  No history of  the same.  Recently started new medication for overactive bladder about 3 weeks ago.  Normal exam but complaining of moderate to severe headache.  We will give her a migraine cocktail as well as obtain basic labs and head CT as this is new.  We will also obtain chest x-ray and EKG for chronic cough that the patient has been having.   [KM]  2038 Patient symptoms improved with meclizine, Toradol and fluids.  Head CT showed no acute findings but possible sinusitis. No emergent EKG findings or lab findings and normal xray.  It is a possibility that this dizziness and headache are coming from her new medication.  Also possibility that this is coming from her sinuses.  It does not appear to be a bacterial sinus infection as she somewhat has chronic sinus issues and is not having any fever or purulent drainage.  She just finished a course of antibiotics and I do not think that it would be helpful to give her another course of antibiotics or steroids at this time.  I will give her a course of of meclizine and naproxen and she was advised to follow-up with both her primary care doctor for her chronic cough and symptoms as well as discuss her new medication with her urologist.  Patient is in agreement with the plan and stable for discharge.   [KM]    Clinical Course User Index [KM] Alveria Apley, PA-C       Based on review of vitals, medical screening exam, lab work and/or imaging, there does not appear to be an acute, emergent etiology for the patient's symptoms. Counseled pt on good return precautions and encouraged both PCP and ED follow-up as needed.  Prior to discharge, I also discussed incidental imaging findings with patient in detail and advised appropriate, recommended follow-up in detail.  Clinical Impression: 1. Acute nonintractable headache, unspecified headache type   2. Dizziness     Disposition: Discharge  Prior to providing a prescription for a controlled substance, I independently  reviewed the patient's recent prescription history on the [place]. The patient had no recent or regular prescriptions and was deemed appropriate for a brief, less than 3 day prescription of narcotic for acute analgesia.  This note was prepared with assistance of Systems analyst. Occasional wrong-word or sound-a-like substitutions may have occurred due to the inherent limitations of voice recognition software.   Final Clinical Impressions(s) / ED Diagnoses   Final diagnoses:  Acute nonintractable headache, unspecified headache type  Dizziness    ED Discharge Orders         Ordered    meclizine (ANTIVERT) 25 MG tablet  3 times daily PRN     04/08/19 2104    naproxen (NAPROSYN) 500 MG tablet  2 times daily     04/08/19 2104           Kristine Royal 04/08/19 2105    Fredia Sorrow, MD 04/19/19 [phone]

## 2019-04-29 ENCOUNTER — Telehealth: Payer: Self-pay | Admitting: *Deleted

## 2019-04-29 NOTE — Telephone Encounter (Signed)
Patient left message that she went to the ER due to the side effects of the medication she was prescribed by Dr Elonda Husky.  She is requesting a call back.

## 2019-04-29 NOTE — Telephone Encounter (Signed)
Patient states she developed a headache 3 days after starting Myrbetriq.  Went to the ER because it was so bad and has not taken it since, headache has resolved.  She wanted me to make you aware and states she does not need anything else at this time.

## 2019-04-30 ENCOUNTER — Telehealth: Payer: Self-pay | Admitting: Obstetrics & Gynecology

## 2019-04-30 ENCOUNTER — Ambulatory Visit: Payer: Medicare HMO | Admitting: Obstetrics & Gynecology

## 2019-04-30 ENCOUNTER — Telehealth: Payer: Self-pay | Admitting: *Deleted

## 2019-04-30 MED ORDER — TOVIAZ 8 MG PO TB24: 8.0000 mg | ORAL_TABLET | Freq: Every day | ORAL | 11 refills | Status: DC

## 2019-04-30 MED ORDER — SOLIFENACIN SUCCINATE 10 MG PO TABS: 10.0000 mg | ORAL_TABLET | Freq: Every day | ORAL | 11 refills | Status: DC

## 2019-04-30 NOTE — Telephone Encounter (Signed)
I changed to toviaz per request

## 2019-04-30 NOTE — Telephone Encounter (Signed)
Sorry bout that  I e prescribed vesicare to [facility] for her to see how that works for her

## 2019-04-30 NOTE — Telephone Encounter (Signed)
Pharmacy send message that patients insurance won't cover the solifenacin 10 mg anymore. They are requesting that a new rx be sent in for oxybutynin.

## 2019-05-03 ENCOUNTER — Telehealth: Payer: Self-pay | Admitting: *Deleted

## 2019-05-03 ENCOUNTER — Other Ambulatory Visit: Payer: Self-pay | Admitting: Obstetrics & Gynecology

## 2019-05-03 MED ORDER — OXYBUTYNIN CHLORIDE ER 10 MG PO TB24: 10.0000 mg | ORAL_TABLET | Freq: Every day | ORAL | 11 refills | Status: DC

## 2019-05-03 NOTE — Telephone Encounter (Signed)
Patient informed Toviaz sent to her pharmacy for her to try if she wishes. Patient stated she would try as long as her insurance will pay for it.

## 2019-05-03 NOTE — Telephone Encounter (Signed)
Ok changed to [employer]

## 2020-03-01 IMAGING — CT CT HEAD W/O CM
4 series · 17 of 47 positions shown, 19 images · non-contrast
Comparison: None.

CLINICAL DATA: Headaches

EXAM:
CT HEAD WITHOUT CONTRAST
TECHNIQUE: Contiguous axial images were obtained from the base of the skull
through the vertex without intravenous contrast.

[Series 2: head w o · axial · 0.39mm/px · z∈[+207,+312]mm · 7 of 29 slices shown, 9 images]
[im 4/29  brain]
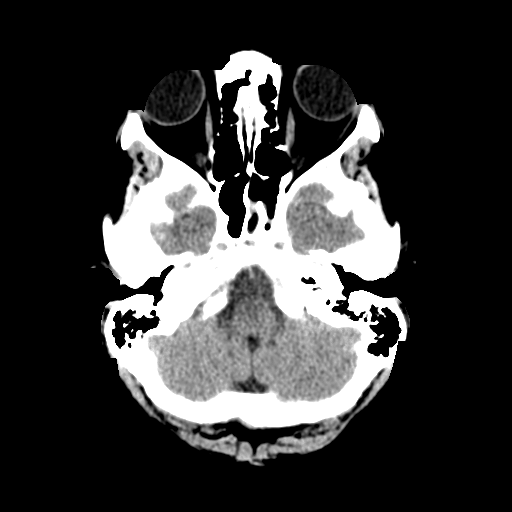
[im 4/29  bone]
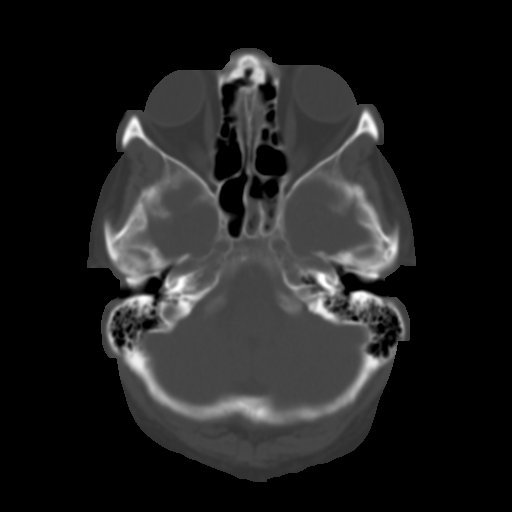
[im 8/29  brain]
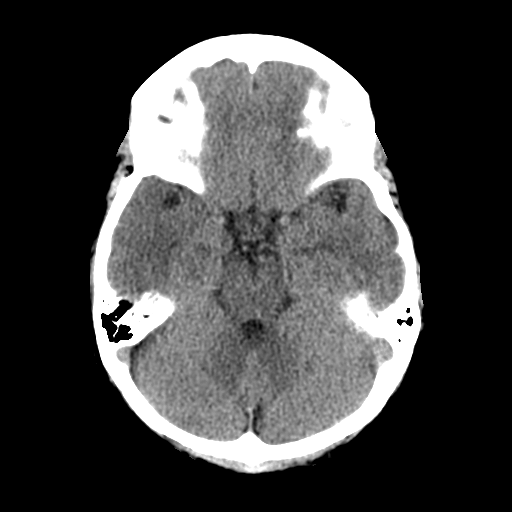
[im 11/29  brain]
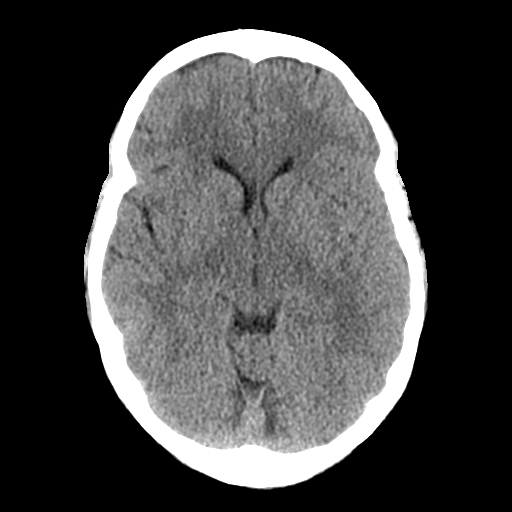
[im 15/29  brain]
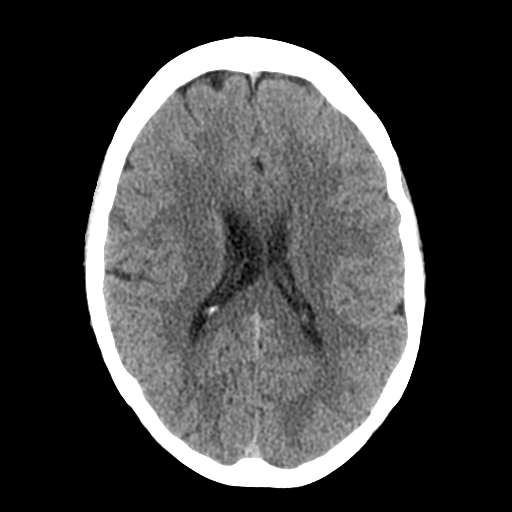
[im 18/29  brain]
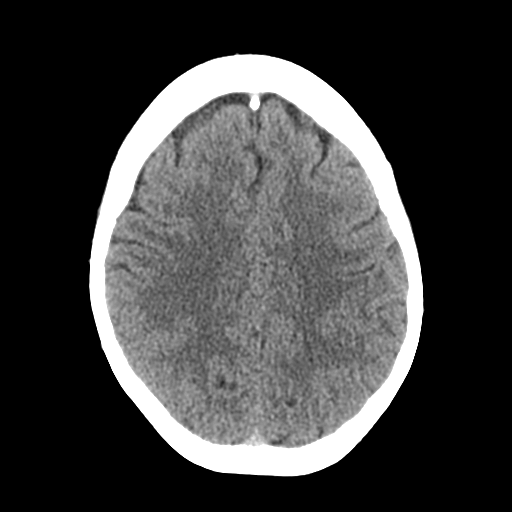
[im 18/29  bone]
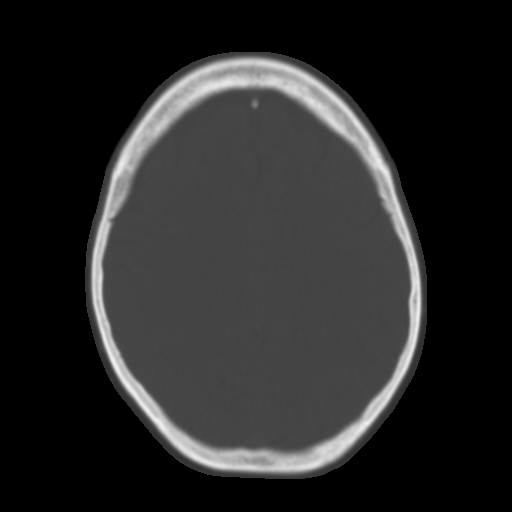
[im 22/29  brain]
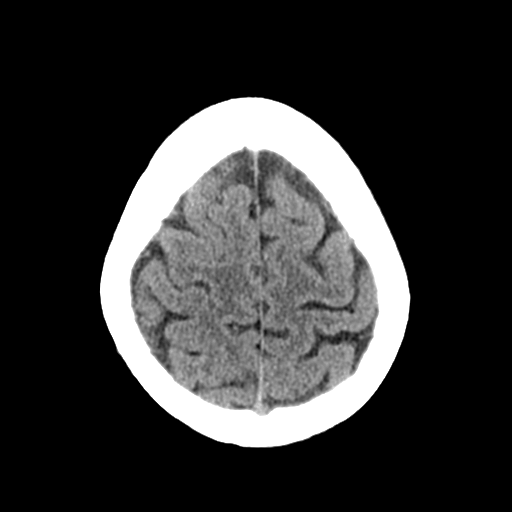
[im 25/29  brain]
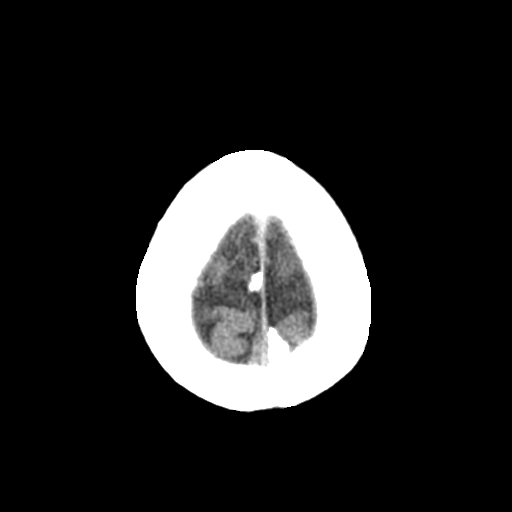

[Series 3: head bone · axial · 0.39mm/px · z∈[+206,+256]mm · 4 of 73 slices shown]
[im 8/73  bone]
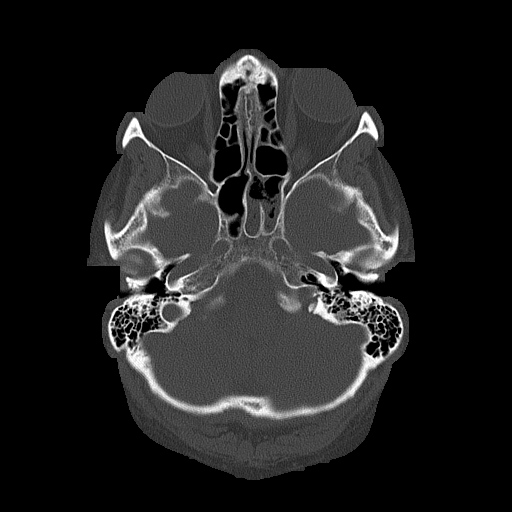
[im 15/73  bone]
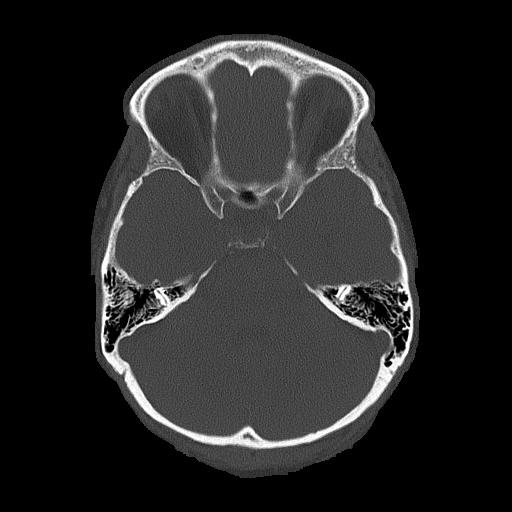
[im 22/73  bone]
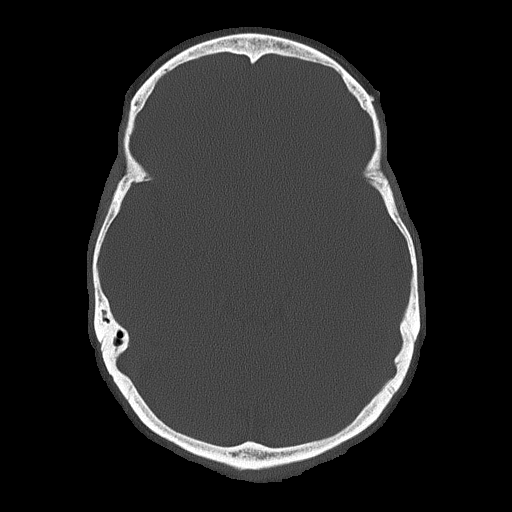
[im 33/73  bone]
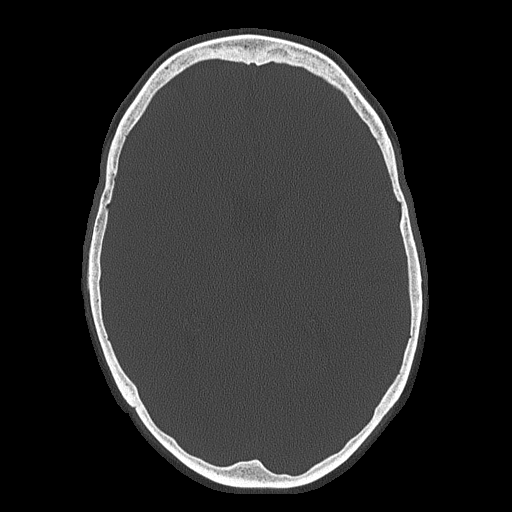

[Series 4: coronal soft · coronal · 0.30mm/px · 3 of 74 slices shown]
[im 25/74  brain]
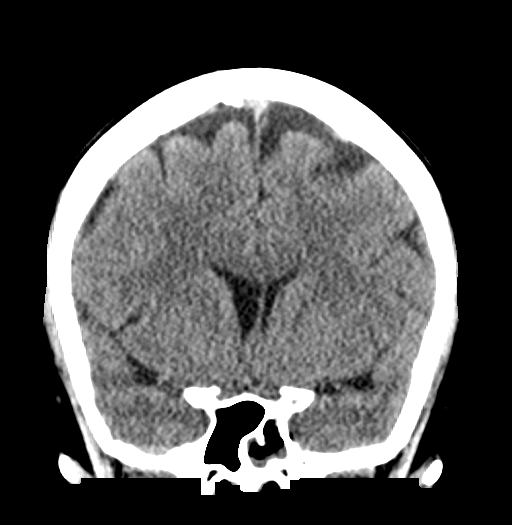
[im 33/74  brain]
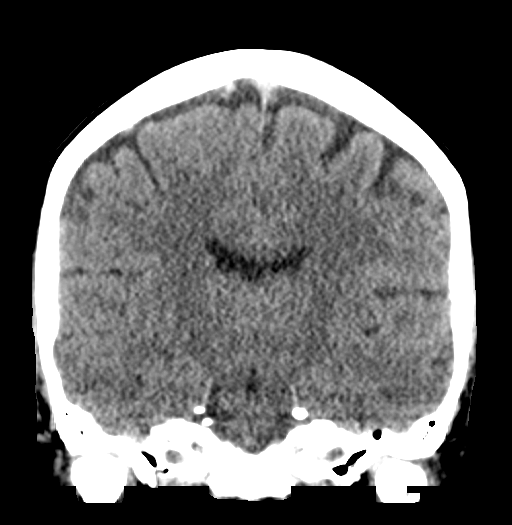
[im 41/74  brain]
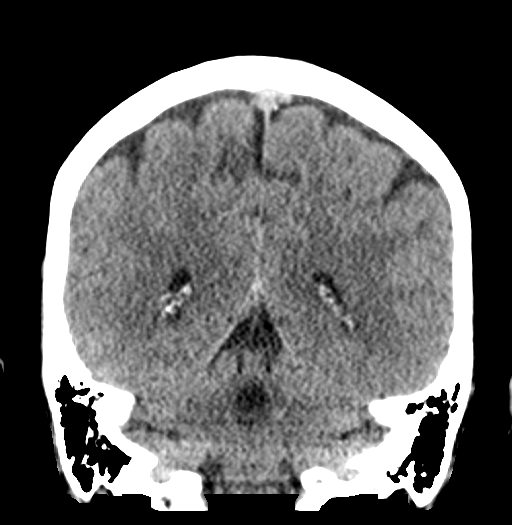

[Series 5: sagittal soft · sagittal · 0.33mm/px · 3 of 59 slices shown]
[im 20/59  brain]
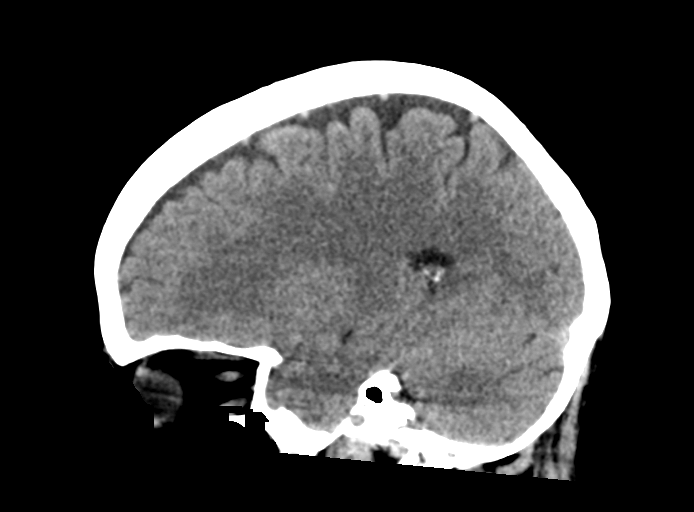
[im 30/59  brain]
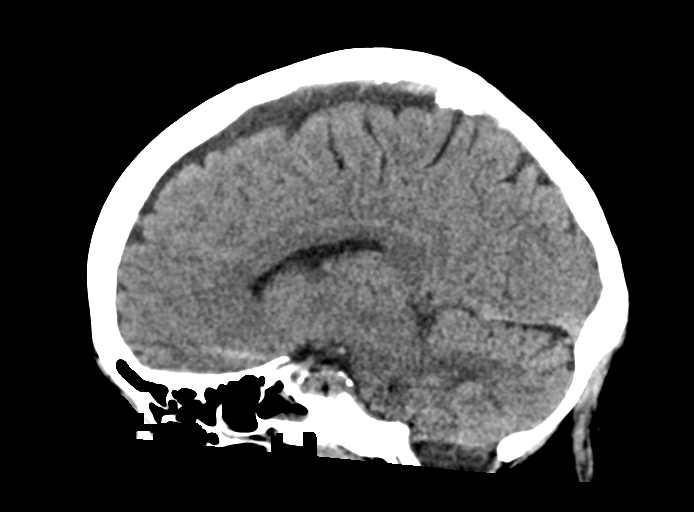
[im 39/59  brain]
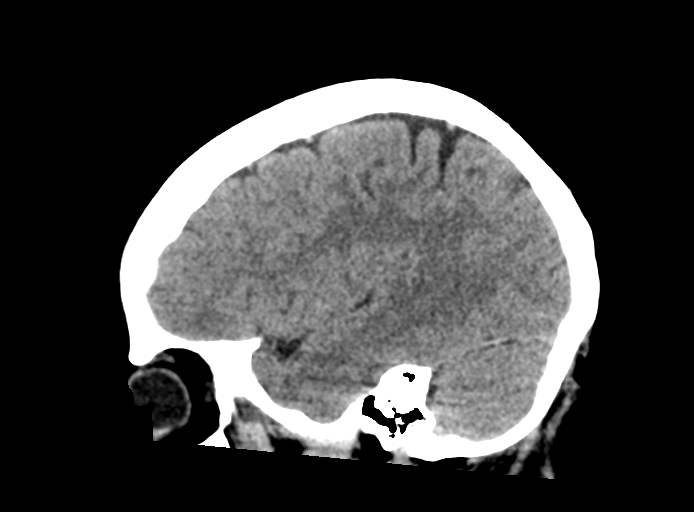

[17 of 47 positions shown; findings below may reference images not displayed]

FINDINGS: Brain: No evidence of acute infarction, hemorrhage, hydrocephalus,
extra-axial collection or mass lesion/mass effect.

Vascular: No hyperdense vessel or unexpected calcification.

Skull: Normal. Negative for fracture or focal lesion.

Sinuses/Orbits: The mastoid air cells are clear. There is asymmetric
opacification of the left maxillary sinus and left side of the
sphenoid sinus

Other: None.
IMPRESSION: 1. No acute intracranial abnormalities.
2. Left maxillary sinus and sphenoid sinus opacification.

## 2021-07-13 ENCOUNTER — Other Ambulatory Visit: Payer: Self-pay

## 2021-07-13 ENCOUNTER — Ambulatory Visit
Admission: RE | Admit: 2021-07-13 | Discharge: 2021-07-13 | Disposition: A | Discharge status: Home / Self Care | Payer: Medicare HMO | Source: Ambulatory Visit | Attending: Emergency Medicine | Admitting: Emergency Medicine

## 2021-07-13 VITALS — BP 129/79 | HR 87 | Temp 97.8°F | Resp 16

## 2021-07-13 DIAGNOSIS — H6982 Other specified disorders of Eustachian tube, left ear: Secondary | ICD-10-CM

## 2021-07-13 DIAGNOSIS — J019 Acute sinusitis, unspecified: Secondary | ICD-10-CM

## 2021-07-13 DIAGNOSIS — Z20828 Contact with and (suspected) exposure to other viral communicable diseases: Secondary | ICD-10-CM

## 2021-07-13 MED ORDER — AMOXICILLIN-POT CLAVULANATE 875-125 MG PO TABS: 1.0000 | ORAL_TABLET | Freq: Two times a day (BID) | ORAL | 0 refills | Status: DC

## 2021-07-13 MED ORDER — PREDNISONE 20 MG PO TABS: 40.0000 mg | ORAL_TABLET | Freq: Every day | ORAL | 0 refills | Status: AC

## 2021-07-13 NOTE — ED Provider Notes (Signed)
HPI  SUBJECTIVE:  Brittany Arellano is a 56 y.o. female who presents with 2 days of left maxillary pain, postnasal drip, scratchy throat, cough, left ear pain, decreased hearing.  She states that her ear feels "stopped up".  When she blows her nose, she gets vertigo which resolves after several seconds.  She has had 3 weeks of nasal congestion, green rhinorrhea.  No wheezing, shortness of breath, facial swelling, upper dental pain.  She got 3 doses of the COVID-vaccine.  no antibiotics in the past month.  No antipyretic in the past 6 hours.  She has been taking Mucinex sinus and Flonase with improvement in her symptoms.   Symptoms are worse when she blows her nose.  She has a past medical history of epilepsy.  She continues to smoke.  PMD: [facility].    Past Medical History:  Diagnosis Date   Epilepsia [facility])     Past Surgical History:  Procedure Laterality Date   ABLATION     COLONOSCOPY WITH PROPOFOL N/A 09/29/2017   Procedure: COLONOSCOPY WITH PROPOFOL;  Surgeon: Corbin Ade, MD;  [facility];  Service: Endoscopy;  Laterality: N/A;  2:15pm   POLYPECTOMY  09/29/2017   Procedure: POLYPECTOMY;  Surgeon: Corbin Ade, MD;  [facility];  Service: Endoscopy;;  ascending colon polyp, descending colon polyp, recto sigmoid colon polyps x3   TUBAL LIGATION      Family History  Problem Relation Age of Onset   Cancer Mother        ovarian in the 58s   Cervical cancer Maternal Grandmother    Lung cancer Other        3 paternal aunts   Cancer Paternal Uncle        unknown type   Colon cancer Neg Hx     Social History   Tobacco Use   Smoking status: Every Day    Packs/day: 1.00    Years: 35.00    Pack years: 35.00    Types: Cigarettes   Smokeless tobacco: Never  Vaping Use   Vaping Use: Never used  Substance Use Topics   Alcohol use: Yes    Comment: rare   Drug use: Yes    Types: Marijuana    No current facility-administered medications  for this encounter.  Current Outpatient Medications:    amoxicillin-clavulanate (AUGMENTIN) 875-125 MG tablet, Take 1 tablet by mouth 2 (two) times daily. X 7 days, Disp: 14 tablet, Rfl: 0   predniSONE (DELTASONE) 20 MG tablet, Take 2 tablets (40 mg total) by mouth daily with breakfast for 5 days., Disp: 10 tablet, Rfl: 0   lamoTRIgine (LAMICTAL) 150 MG tablet, Take 150 mg by mouth at bedtime. , Disp: , Rfl:    meclizine (ANTIVERT) 25 MG tablet, Take 1 tablet (25 mg total) by mouth 3 (three) times daily as needed for dizziness., Disp: 30 tablet, Rfl: 0   meloxicam (MOBIC) 7.5 MG tablet, Take 15 mg by mouth daily., Disp: , Rfl:    Omega-3 Fatty Acids (FISH OIL) 1000 MG CAPS, Take 1,000 mg by mouth daily. , Disp: , Rfl:    omeprazole (PRILOSEC) 20 MG capsule, Take 20 mg by mouth daily as needed (acid reflux). , Disp: , Rfl:    Triamcinolone Acetonide (NASACORT ALLERGY 24HR NA), Place 1 spray into the nose daily., Disp: , Rfl:    zonisamide (ZONEGRAN) 100 MG capsule, Take 100 mg by mouth at bedtime. , Disp: , Rfl:   No Known  Allergies   ROS  As noted in HPI.   Physical Exam  BP 129/79 (BP Location: Right Arm)    Pulse 87    Temp 97.8 F (36.6 C) (Oral)    Resp 16    SpO2 95%   Constitutional: Well developed, well nourished, no acute distress Eyes:  EOMI, conjunctiva normal bilaterally HENT: Normocephalic, atraumatic,mucus membranes moist.  Erythematous, swollen turbinates.  Purulent nasal congestion.  No maxillary, frontal sinus tenderness.  TMs normal bilaterally. Neck: No cervical lymphadenopathy Respiratory: Normal inspiratory effort, lungs clear bilaterally Cardiovascular: Normal rate GI: nondistended skin: No rash, skin intact Musculoskeletal: no deformities Neurologic: Alert & oriented x 3, no focal neuro deficits Psychiatric: Speech and behavior appropriate   ED Course   Medications - No data to display  Orders Placed This Encounter  Procedures   Covid-19, Flu A+B  ([facility])    Standing Status:   Standing    Number of Occurrences:   1    No results found for this or any previous visit (from the past 24 hour(s)). No results found.  ED Clinical Impression  1. Acute non-recurrent sinusitis, unspecified location   2. Exposure to the flu   3. Eustachian tube dysfunction, left      ED Assessment/Plan  Patient with a sinusitis and eustachian tube dysfunction.  We will have her continue Mucinex sinus, Flonase.  Start saline nasal irrigation, prednisone 40 mg for 5 days, Augmentin .  COVID, flu sent.  Unfortunately, she is out of the window for influenza treatment, however, consider Molnupiravir if COVID is positive.    Discussed MDM, treatment plan, and plan for follow-up with patient. patient agrees with plan.   Meds ordered this encounter  Medications   amoxicillin-clavulanate (AUGMENTIN) 875-125 MG tablet    Sig: Take 1 tablet by mouth 2 (two) times daily. X 7 days    Dispense:  14 tablet    Refill:  0   predniSONE (DELTASONE) 20 MG tablet    Sig: Take 2 tablets (40 mg total) by mouth daily with breakfast for 5 days.    Dispense:  10 tablet    Refill:  0      *This clinic note was created using Scientist, clinical (histocompatibility and immunogenetics). Therefore, there may be occasional mistakes despite careful proofreading.  ?    Domenick Gong, MD 07/14/21 [phone]

## 2021-07-13 NOTE — ED Triage Notes (Signed)
Patient states she ws sick with sinus issues at the beginning of the year.  Patient states that 2 days ago her left ear started hurting and she blew her nose and the room started spinning   Patient states she has been taking OTC sinus meds   Denies Fever

## 2021-07-13 NOTE — Discharge Instructions (Addendum)
continue Mucinex sinus, Flonase.  Start saline nasal irrigation with a [facility] Med rinse and distilled water as often as you want, prednisone 40 mg for 5 days, finish the Augmentin, even if you feel better.

## 2021-07-14 LAB — COVID-19, FLU A+B NAA
Influenza A, NAA: NOT DETECTED
Influenza B, NAA: NOT DETECTED
SARS-CoV-2, NAA: NOT DETECTED

## 2021-08-15 HISTORY — PX: KNEE SURGERY: SHX244

## 2022-05-21 ENCOUNTER — Encounter: Payer: Self-pay | Admitting: Neurology

## 2022-07-31 ENCOUNTER — Ambulatory Visit: Payer: Medicare HMO | Admitting: Neurology

## 2022-08-21 ENCOUNTER — Ambulatory Visit (INDEPENDENT_AMBULATORY_CARE_PROVIDER_SITE_OTHER): Payer: Medicare HMO | Admitting: Neurology

## 2022-08-21 ENCOUNTER — Encounter: Payer: Self-pay | Admitting: Neurology

## 2022-08-21 VITALS — BP 154/71 | HR 86 | Ht 62.0 in | Wt 176.8 lb

## 2022-08-21 DIAGNOSIS — G40309 Generalized idiopathic epilepsy and epileptic syndromes, not intractable, without status epilepticus: Secondary | ICD-10-CM | POA: Diagnosis not present

## 2022-08-21 MED ORDER — LAMICTAL 150 MG PO TABS: 150.0000 mg | ORAL_TABLET | Freq: Two times a day (BID) | ORAL | 11 refills | Status: DC

## 2022-08-21 MED ORDER — ZONISAMIDE 100 MG PO CAPS: ORAL_CAPSULE | ORAL | 3 refills | Status: DC

## 2022-08-21 NOTE — Patient Instructions (Addendum)
Good to meet you.  Schedule EEG  2. Continue Lamictal 150mg  every night and Zonisamide 100mg  every night  3. Follow-up in 6 months, call for any changes.   Seizure Precautions: 1. If medication has been prescribed for you to prevent seizures, take it exactly as directed.  Do not stop taking the medicine without talking to your doctor first, even if you have not had a seizure in a long time.   2. Avoid activities in which a seizure would cause danger to yourself or to others.  Don't operate dangerous machinery, swim alone, or climb in high or dangerous places, such as on ladders, roofs, or girders.  Do not drive unless your doctor says you may.  3. If you have any warning that you may have a seizure, lay down in a safe place where you can't hurt yourself.    4.  No driving for 6 months from last seizure, as per [facility].   Please refer to the following link on the [place] website for more information: http://www.epilepsyfoundation.org/answerplace/Social/driving/drivingu.cfm   5.  Maintain good sleep hygiene. Avoid alcohol.  6.  Contact your doctor if you have any problems that may be related to the medicine you are taking.  7.  Call 911 and bring the patient back to the ED if:        A.  The seizure lasts longer than 5 minutes.       B.  The patient doesn't awaken shortly after the seizure  C.  The patient has new problems such as difficulty seeing, speaking or moving  D.  The patient was injured during the seizure  E.  The patient has a temperature over 102 F (39C)  F.  The patient vomited and now is having trouble breathing

## 2022-08-21 NOTE — Progress Notes (Signed)
NEUROLOGY CONSULTATION NOTE  Brittany Arellano MRN: [id number] DOB: Jul 20, 1965  Referring provider: Dr. Phillips Odor Primary care provider: [facility]  Reason for consult:  establish care for seizures  Dear Dr Merlene Laughter:  Thank you for your kind referral of Brittany Arellano for consultation of the above symptoms. Although her history is well known to you, please allow me to reiterate it for the purpose of our medical record. She is alone in the office today. Records and images were personally reviewed where available.   HISTORY OF PRESENT ILLNESS: This is a pleasant 57 year old right-handed woman presenting for transfer of care for seizures. Her prior neurologist Dr. Merlene Laughter has closed his practice. Seizures started at age 17. She denies any prior warning symptoms. She has been told she stares or stops in a middle of a conversation for a few seconds. She catches herself every once in a while and states it does not happen a lot. She reports myoclonic jerks especially in the morning, occurring 1-2 times a month. She recalls several years ago waking up feeling sore but not sure. She reports last GTC was around 12 years ago, seizures stopped when she was started on Zonisamide as part of a study at [place]. She states that brand Lamictal 150mg  is prescribed BID but she has been taking it only qhs for the past 20 years. Zonisamide 100mg  is prescribed 2 caps qhs but she only takes 1 cap qhs. She denies any olfactory/gustatory hallucinations, focal numbness/tingling/weakness. She has headaches that she attributes to her sinuses. No nausea/vomiting, photo/phonophobia. She has noticed some difficulty swallowing. No neck/back pain, bowel/bladder dysfunction. She is constantly feeling the urge to move her legs at night, she puts a weighted blanket and needs to walk around. She has some memory changes, needing a lot of reminders and alarms. She denies missing bill payments. She does not  drive. She usually gets 6-7 hours of sleep, THC gummies help. She has some difficulty with sleep initiation. Mood varies, she reports she is undergoing menopause and patience is not what it used to be. She drinks alcohol socially.   Epilepsy Risk Factors:  She had a normal birth and early development.  There is no history of febrile convulsions, CNS infections such as meningitis/encephalitis, significant traumatic brain injury, neurosurgical procedures, or family history of seizures.  Prior ASMs: Depakote, generic Lamotrigine (more seizures)  Diagnostic Data: EEGs: Per notes: EEG showed a single episode of generalized epileptiform discharge MRI: MRI brain in 05/2012 at [facility] was normal.   PAST MEDICAL HISTORY: Past Medical History:  Diagnosis Date   Epilepsia     PAST SURGICAL HISTORY: Past Surgical History:  Procedure Laterality Date   ABLATION     COLONOSCOPY WITH PROPOFOL N/A 09/29/2017   Procedure: COLONOSCOPY WITH PROPOFOL;  Surgeon: Daneil Dolin, MD;  Location: [facility];  Service: Endoscopy;  Laterality: N/A;  2:15pm   KNEE SURGERY  08/15/2021   POLYPECTOMY  09/29/2017   Procedure: POLYPECTOMY;  Surgeon: Daneil Dolin, MD;  Location: [facility];  Service: Endoscopy;;  ascending colon polyp, descending colon polyp, recto sigmoid colon polyps x3   TUBAL LIGATION      MEDICATIONS: Current Outpatient Medications on File Prior to Visit  Medication Sig Dispense Refill   fexofenadine (ALLEGRA) 180 MG tablet Take 180 mg by mouth daily.     fluticasone (FLONASE) 50 MCG/ACT nasal spray Place 2 sprays into both nostrils daily.     lamoTRIgine (LAMICTAL) 150  MG tablet Take 150 mg by mouth at bedtime.      meclizine (ANTIVERT) 25 MG tablet Take 1 tablet (25 mg total) by mouth 3 (three) times daily as needed for dizziness. 30 tablet 0   meloxicam (MOBIC) 7.5 MG tablet Take 15 mg by mouth daily.     Omega-3 Fatty Acids (FISH OIL) 1000 MG CAPS Take 1,000 mg by mouth daily.       omeprazole (PRILOSEC) 20 MG capsule Take 20 mg by mouth daily as needed (acid reflux).      zonisamide (ZONEGRAN) 100 MG capsule Take 100 mg by mouth at bedtime.      No current facility-administered medications on file prior to visit.    ALLERGIES: No Known Allergies  FAMILY HISTORY: Family History  Problem Relation Age of Onset   Cancer Mother        ovarian in the 59s   Cervical cancer Maternal Grandmother    Lung cancer Other        3 paternal aunts   Cancer Paternal Uncle        unknown type   Colon cancer Neg Hx     SOCIAL HISTORY: Social History   Socioeconomic History   Marital status: Divorced    Spouse name: Not on file   Number of children: 1   Years of education: Not on file   Highest education level: Not on file  Occupational History   Not on file  Tobacco Use   Smoking status: Every Day    Packs/day: 1.00    Years: 35.00    Total pack years: 35.00    Types: Cigarettes   Smokeless tobacco: Never  Vaping Use   Vaping Use: Never used  Substance and Sexual Activity   Alcohol use: Yes    Comment: rare   Drug use: Yes    Types: Marijuana   Sexual activity: Yes    Birth control/protection: Surgical  Other Topics Concern   Not on file  Social History Narrative   Are you right handed or left handed? Right handed    Are you currently employed ?  no   What is your current occupation? Disability    Do you live at home alone? alone   Who lives with you? no   What type of home do you live in: 1 story or 2 story? 1 story has 7 steps to get into home     Grandma    Social Determinants of Health   Financial Resource Strain: Not on file  Food Insecurity: Not on file  Transportation Needs: Not on file  Physical Activity: Not on file  Stress: Not on file  Social Connections: Not on file  Intimate Partner Violence: Not on file     PHYSICAL EXAM: Vitals:   08/21/22 1332  BP: (!) 154/71  Pulse: 86  SpO2: 98%   General: No acute  distress Head:  Normocephalic/atraumatic Skin/Extremities: No rash, no edema Neurological Exam: Mental status: alert and oriented to person, place, and time, no dysarthria or aphasia, Fund of knowledge is appropriate.  Recent and remote memory are intact, 3/3 delayed recall.  Attention and concentration are normal, 5/5 WORLD backwards.  Cranial nerves: CN I: not tested CN II: pupils equal, round, visual fields intact CN III, IV, VI:  full range of motion, no nystagmus, no ptosis CN V: facial sensation intact CN VII: upper and lower face symmetric CN VIII: hearing intact to conversation Bulk & Tone: normal, no fasciculations. Motor:  5/5 throughout with no pronator drift. Sensation: intact to light touch, cold, pin, vibration sense.  No extinction to double simultaneous stimulation.  Romberg test negative Deep Tendon Reflexes: +1 throughout Cerebellar: no incoordination on finger to nose testing Gait: narrow-based and steady, able to tandem walk adequately. Tremor: none   IMPRESSION: This is a pleasant 57 year old right-handed woman with a history of primary generalized epilepsy presenting to establish care. Per notes, prior EEG showed a single episode of generalized discharge (report unavailable for review). MRI brain normal. She denies any convulsions in 12 years. She has occasional myoclonic jerks. EEG will be ordered. Continue brand Lamictal 150mg  daily and Zonisamide 100mg  qhs, refills sent. We discussed avoidance of seizure triggers, including missing medication, sleep deprivation, alcohol. She does not drive. Follow-up in 6 months, call for any changes.    Thank you for allowing me to participate in the care of this patient. Please do not hesitate to call for any questions or concerns.   Ellouise Newer, M.D.  CC: Dr. Merlene Laughter, [facility].

## 2022-09-02 ENCOUNTER — Other Ambulatory Visit: Payer: Medicare HMO

## 2022-09-03 ENCOUNTER — Ambulatory Visit (INDEPENDENT_AMBULATORY_CARE_PROVIDER_SITE_OTHER): Payer: Medicare HMO | Admitting: Neurology

## 2022-09-03 DIAGNOSIS — G40309 Generalized idiopathic epilepsy and epileptic syndromes, not intractable, without status epilepticus: Secondary | ICD-10-CM

## 2022-09-03 NOTE — Progress Notes (Unsigned)
EEG complete - results pending 

## 2022-09-04 NOTE — Procedures (Signed)
ELECTROENCEPHALOGRAM REPORT  Date of Study: 09/03/2022  Patient's Name: Brittany Arellano MRN: [id number] Date of Birth: Aug 11, 1965  Referring Provider: Dr. Ellouise Newer  Clinical History: This is a 57 year old woman with recurrent episodes of staring, body jerks. EEG for classification.  Medications: Lamictal, Zonisamide  Technical Summary: A multichannel digital EEG recording measured by the international 10-20 system with electrodes applied with paste and impedances below 5000 ohms performed in our laboratory with EKG monitoring in an awake patient.  Hyperventilation and photic stimulation were performed.  The digital EEG was referentially recorded, reformatted, and digitally filtered in a variety of bipolar and referential montages for optimal display.    Description: The patient is awake during the recording.  During maximal wakefulness, there is a symmetric, medium voltage 10 Hz posterior dominant rhythm that attenuates with eye opening.  The record is symmetric.  Sleep was not captured. Hyperventilation and photic stimulation did not elicit any abnormalities.  There were no epileptiform discharges or electrographic seizures seen.    EKG lead was unremarkable.  Impression: This awake EEG is normal.    Clinical Correlation: A normal EEG does not exclude a clinical diagnosis of epilepsy.  If further clinical questions remain, prolonged EEG may be helpful.  Clinical correlation is advised.   Ellouise Newer, M.D.

## 2022-09-05 ENCOUNTER — Telehealth: Payer: Self-pay

## 2022-09-05 NOTE — Telephone Encounter (Signed)
-----   Message from Cameron Sprang, MD sent at 09/05/2022 12:35 PM EDT ----- Pls let her know the EEG is normal, continue all medications, thanks

## 2022-09-05 NOTE — Telephone Encounter (Signed)
Pt called an informed that the EEG is normal, continue all medications

## 2022-09-09 ENCOUNTER — Telehealth: Payer: Self-pay | Admitting: Anesthesiology

## 2022-09-09 NOTE — Telephone Encounter (Signed)
It may have triggered a migraine if its lasting this long. Can someone bring her to the office for a migraine shot? Ok to give cocktail, thanks

## 2022-09-09 NOTE — Telephone Encounter (Signed)
Pt has rizatriptan from her old DR she stated she was going to take one of them and she will call in the morning she stated that she does not have a driver. She stated she will call back in the morning,

## 2022-09-09 NOTE — Telephone Encounter (Signed)
Pt called stating she had an EEG on 09/03/22, she's been having a headache since that wont go away. States is this normal?? Requests call back.

## 2022-09-09 NOTE — Telephone Encounter (Signed)
Pt stated that she has been taken ibuprofen and a sinus pill, she thinks the flasking light caused it,

## 2022-09-11 ENCOUNTER — Ambulatory Visit: Payer: Medicare HMO

## 2022-09-11 ENCOUNTER — Other Ambulatory Visit (INDEPENDENT_AMBULATORY_CARE_PROVIDER_SITE_OTHER): Payer: Medicare HMO

## 2022-09-11 DIAGNOSIS — G43911 Migraine, unspecified, intractable, with status migrainosus: Secondary | ICD-10-CM

## 2022-09-11 DIAGNOSIS — R519 Headache, unspecified: Secondary | ICD-10-CM

## 2022-09-11 MED ORDER — METOCLOPRAMIDE HCL 5 MG/ML IJ SOLN
10.0000 mg | Freq: Once | INTRAMUSCULAR | Status: AC
  Administered 2022-09-11: 10 mg via INTRAMUSCULAR

## 2022-09-11 MED ORDER — METOCLOPRAMIDE HCL 5 MG/ML IJ SOLN: 10.0000 mg | Freq: Once | INTRAMUSCULAR | Status: DC

## 2022-09-11 MED ORDER — DIPHENHYDRAMINE HCL 50 MG/ML IJ SOLN: 50.0000 mg | Freq: Once | INTRAMUSCULAR | Status: DC

## 2022-09-11 MED ORDER — KETOROLAC TROMETHAMINE 60 MG/2ML IM SOLN: 60.0000 mg | Freq: Once | INTRAMUSCULAR | Status: DC

## 2022-09-11 MED ORDER — KETOROLAC TROMETHAMINE 60 MG/2ML IM SOLN
60.0000 mg | Freq: Once | INTRAMUSCULAR | Status: AC
  Administered 2022-09-11: 60 mg via INTRAMUSCULAR

## 2022-09-11 MED ORDER — DIPHENHYDRAMINE HCL 50 MG/ML IJ SOLN
50.0000 mg | Freq: Once | INTRAMUSCULAR | Status: AC
  Administered 2022-09-11: 25 mg via INTRAMUSCULAR

## 2022-10-14 ENCOUNTER — Ambulatory Visit: Payer: Medicare HMO | Admitting: Neurology

## 2023-02-25 ENCOUNTER — Other Ambulatory Visit (HOSPITAL_COMMUNITY): Payer: Self-pay | Admitting: Nurse Practitioner

## 2023-02-25 DIAGNOSIS — R109 Unspecified abdominal pain: Secondary | ICD-10-CM

## 2023-02-25 DIAGNOSIS — N3000 Acute cystitis without hematuria: Secondary | ICD-10-CM

## 2023-02-27 ENCOUNTER — Ambulatory Visit (HOSPITAL_COMMUNITY)
Admission: RE | Admit: 2023-02-27 | Discharge: 2023-02-27 | Disposition: A | Discharge status: Home / Self Care | Payer: Medicare HMO | Source: Ambulatory Visit | Attending: Nurse Practitioner | Admitting: Nurse Practitioner

## 2023-02-27 DIAGNOSIS — N3000 Acute cystitis without hematuria: Secondary | ICD-10-CM | POA: Diagnosis present

## 2023-02-27 DIAGNOSIS — R109 Unspecified abdominal pain: Secondary | ICD-10-CM | POA: Diagnosis present

## 2023-03-07 ENCOUNTER — Ambulatory Visit: Payer: Medicare HMO | Admitting: Neurology

## 2023-07-09 ENCOUNTER — Encounter: Payer: Self-pay | Admitting: Neurology

## 2023-07-09 ENCOUNTER — Telehealth (INDEPENDENT_AMBULATORY_CARE_PROVIDER_SITE_OTHER): Payer: Medicare HMO | Admitting: Neurology

## 2023-07-09 VITALS — Ht 62.0 in | Wt 172.0 lb

## 2023-07-09 DIAGNOSIS — G40309 Generalized idiopathic epilepsy and epileptic syndromes, not intractable, without status epilepticus: Secondary | ICD-10-CM

## 2023-07-09 MED ORDER — LAMICTAL 150 MG PO TABS: 150.0000 mg | ORAL_TABLET | Freq: Two times a day (BID) | ORAL | 11 refills | Status: DC

## 2023-07-09 MED ORDER — ZONISAMIDE 100 MG PO CAPS: ORAL_CAPSULE | ORAL | 3 refills | Status: DC

## 2023-07-09 NOTE — Patient Instructions (Signed)
Good to see you doing well! Continue all your medications, let me know if any problems. Follow-up in 1 year, call for any changes   Seizure Precautions: 1. If medication has been prescribed for you to prevent seizures, take it exactly as directed.  Do not stop taking the medicine without talking to your doctor first, even if you have not had a seizure in a long time.   2. Avoid activities in which a seizure would cause danger to yourself or to others.  Don't operate dangerous machinery, swim alone, or climb in high or dangerous places, such as on ladders, roofs, or girders.  Do not drive unless your doctor says you may.  3. If you have any warning that you may have a seizure, lay down in a safe place where you can't hurt yourself.    4.  No driving for 6 months from last seizure, as per [facility].   Please refer to the following link on the Epilepsy Foundation of America's website for more information: http://www.epilepsyfoundation.org/answerplace/Social/driving/drivingu.cfm   5.  Maintain good sleep hygiene. Avoid alcohol  6.  Contact your doctor if you have any problems that may be related to the medicine you are taking.  7.  Call 911 and bring the patient back to the ED if:        A.  The seizure lasts longer than 5 minutes.       B.  The patient doesn't awaken shortly after the seizure  C.  The patient has new problems such as difficulty seeing, speaking or moving  D.  The patient was injured during the seizure  E.  The patient has a temperature over 102 F (39C)  F.  The patient vomited and now is having trouble breathing

## 2023-07-09 NOTE — Progress Notes (Signed)
Virtual Visit via Video Note The purpose of this virtual visit is to provide medical care while limiting exposure to the novel coronavirus.    Consent was obtained for video visit:  Yes.   Answered questions that patient had about telehealth interaction:  Yes.     Pt location: Home Physician Location: office Name of referring provider:  [facility] I connected with Brittany Arellano at patients initiation/request on 07/09/2023 at  3:00 PM EST by video enabled telemedicine application and verified that I am speaking with the correct person using two identifiers. Pt MRN:  [id number] Pt DOB:  1965/11/12 Video Participants:  Brittany Arellano   History of Present Illness: The patient had a virtual video visit on 07/09/2023. 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 EEG in 08/2022 was normal. Since her last visit, she reports doing very well, she is happy to report she recently got her driver's license after she stopped driving 28 years ago. She denies any staring/unresponsive episodes, gaps in time, olfactory/gustatory hallucinations, focal numbness/tingling/weakness, myoclonic jerks. No side effects on brand Lamictal 150mg  daily and Zonisamide 100mg  at bedtime. She had a headache after the EEG but denies any further headaches since then. No dizziness, vision changes, no falls. Sleep and mood are good. She lives alone. 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    History On Initial Assessment 08/21/2022: This is a pleasant 58 year old right-handed woman presenting for transfer of care for seizures. Her prior neurologist Dr. Gerilyn Pilgrim has closed his practice. Seizures started at age 14. She denies any prior warning symptoms. She has been told she stares or stops in a middle of a conversation for a few seconds. She catches herself every once in a while and states it does not happen a lot. She reports myoclonic jerks especially in the  morning, occurring 1-2 times a month. She recalls several years ago waking up feeling sore but not sure. She reports last GTC was around 12 years ago, seizures stopped when she was started on Zonisamide as part of a study at [place]. She states that brand Lamictal 150mg  is prescribed BID but she has been taking it only qhs for the past 20 years. Zonisamide 100mg  is prescribed 2 caps qhs but she only takes 1 cap qhs. She denies any olfactory/gustatory hallucinations, focal numbness/tingling/weakness. She has headaches that she attributes to her sinuses. No nausea/vomiting, photo/phonophobia. She has noticed some difficulty swallowing. No neck/back pain, bowel/bladder dysfunction. She is constantly feeling the urge to move her legs at night, she puts a weighted blanket and needs to walk around. She has some memory changes, needing a lot of reminders and alarms. She denies missing bill payments. She does not drive. She usually gets 6-7 hours of sleep, THC gummies help. She has some difficulty with sleep initiation. Mood varies, she reports she is undergoing menopause and patience is not what it used to be. She drinks alcohol socially.   Epilepsy Risk Factors:  She had a normal birth and early development.  There is no history of febrile convulsions, CNS infections such as meningitis/encephalitis, significant traumatic brain injury, neurosurgical procedures, or family history of seizures.  Prior ASMs: Depakote, generic Lamotrigine (more seizures)  Diagnostic Data: EEGs: Per notes: EEG showed a single episode of generalized epileptiform discharge MRI: MRI brain in 05/2012 at [facility] was normal.    Current Outpatient Medications on File Prior to Visit  Medication Sig Dispense  Refill   fexofenadine (ALLEGRA) 180 MG tablet Take 180 mg by mouth daily.     LAMICTAL 150 MG tablet Take 1 tablet (150 mg total) by mouth 2 (two) times daily. 60 tablet 11   meclizine (ANTIVERT) 25 MG tablet Take 1 tablet (25 mg total) by  mouth 3 (three) times daily as needed for dizziness. 30 tablet 0   meloxicam (MOBIC) 7.5 MG tablet Take 15 mg by mouth daily.     omeprazole (PRILOSEC) 20 MG capsule Take 20 mg by mouth daily as needed (acid reflux).      zonisamide (ZONEGRAN) 100 MG capsule Take 2 capsules every night 180 capsule 3   Omega-3 Fatty Acids (FISH OIL) 1000 MG CAPS Take 1,000 mg by mouth daily.  (Patient not taking: Reported on 07/09/2023)     No current facility-administered medications on file prior to visit.     Observations/Objective:   Vitals:   07/09/23 0935  Weight: 172 lb (78 kg)  Height: 5\' 2"  (1.575 m)   GEN:  The patient appears stated age and is in NAD.  Neurological examination: Patient is awake, alert. No aphasia or dysarthria. Intact fluency and comprehension. Cranial nerves: Extraocular movements intact with no nystagmus. No facial asymmetry. Motor: moves all extremities symmetrically, at least anti-gravity x 4.   Assessment and Plan:   This is a pleasant 58 yo RH woman with a history of primary generalized epilepsy. Per notes, prior EEG showed a single episode of generalized discharge (report unavailable for review). MRI brain normal. She denies any convulsions in 13 years. EEG in 2024 normal. She is doing well on brand Lamictal and Zonisamide, refills sent. She is aware of [place] driving laws to stop driving after a seizure until 6 months seizure-free. Follow-up in 1 year, call for any changes.    Follow Up Instructions:   -I discussed the assessment and treatment plan with the patient. The patient was provided an opportunity to ask questions and all were answered. The patient agreed with the plan and demonstrated an understanding of the instructions.   The patient was advised to call back or seek an in-person evaluation if the symptoms worsen or if the condition fails to improve as anticipated.     Van Clines, MD

## 2023-09-03 ENCOUNTER — Telehealth: Payer: Self-pay

## 2023-09-03 NOTE — Telephone Encounter (Signed)
 Pt needs a PA for name brand Lamictal

## 2023-09-12 ENCOUNTER — Other Ambulatory Visit (HOSPITAL_COMMUNITY): Payer: Self-pay

## 2023-09-12 ENCOUNTER — Telehealth: Payer: Self-pay | Admitting: Pharmacy Technician

## 2023-09-12 NOTE — Telephone Encounter (Signed)
 Pharmacy Patient Advocate Encounter   Received notification from Pt Calls Messages that prior authorization for LAMICTAL 150MG  [facility]) is required/requested.   Insurance verification completed.   The patient is insured through [employer] .   Per test claim: PA required; PA submitted to above mentioned insurance via CoverMyMeds Key/confirmation #/EOC [id number] Status is pending

## 2023-09-12 NOTE — Telephone Encounter (Signed)
 PA has been submitted, and telephone encounter has been created. Please see telephone encounter dated 3.28.25.

## 2023-09-15 MED ORDER — LAMICTAL 150 MG PO TABS: 150.0000 mg | ORAL_TABLET | Freq: Two times a day (BID) | ORAL | 11 refills | Status: DC

## 2023-09-15 NOTE — Telephone Encounter (Signed)
 Pharmacy Patient Advocate Encounter  Received notification from [facility] that Prior Authorization for  LAMICTAL 150MG  [facility]) has been APPROVED from 09-12-2023 to 06-16-2024   PA #/Case ID/Reference #: [id number]

## 2023-11-11 NOTE — Progress Notes (Unsigned)
 GI Office Note    Referring Provider: [facility] Primary Care Physician:  [facility]  Primary Gastroenterologist: Windsor Hatcher.Rourk, MD  Chief Complaint   No chief complaint on file.  History of Present Illness   Brittany Arellano is a 58 y.o. female presenting today at the request of [facility] for fatty liver on US .   Colonoscopy April 2019: - Five 4 to 6 mm polyps at the rectosigmoid colon, descending, and ascending colon - Nonbleeding internal hemorrhoids - Exam otherwise normal - Path: Hyperplastic polyps - Advised repeat in 10 years  Per referral notes, she had visit dated 5/12 with PCP to discuss diagnoses of fatty liver and also still some complaints of left lower quadrant pain described as a 3 out of 10 like the start of a menstrual cramp.  Fatty liver education provided.  Patient was counseled on weight loss.  Ultrasound report provided, see below.  No labs.  Abdominal ultrasound completed September 2024 which revealed increased echotexture with probable focal fatty sparing around the gallbladder.  Liver cysts noted largest measuring 1.4 x 1.3 x 1 cm in the left lobe.  Patent portal vein on Doppler imaging.  Visualized pancreas and spleen within normal limits.   Today:    Wt Readings from Last 3 Encounters:  07/09/23 172 lb (78 kg)  08/21/22 176 lb 12.8 oz (80.2 kg)  04/08/19 170 lb (77.1 kg)    Current Outpatient Medications  Medication Sig Dispense Refill   fexofenadine (ALLEGRA) 180 MG tablet Take 180 mg by mouth daily.     LAMICTAL  150 MG tablet Take 1 tablet (150 mg total) by mouth 2 (two) times daily. 60 tablet 11   meclizine  (ANTIVERT ) 25 MG tablet Take 1 tablet (25 mg total) by mouth 3 (three) times daily as needed for dizziness. 30 tablet 0   meloxicam (MOBIC) 7.5 MG tablet Take 15 mg by mouth daily.     Omega-3 Fatty Acids (FISH OIL) 1000 MG CAPS Take 1,000 mg by mouth daily.  (Patient not taking:  Reported on 07/09/2023)     omeprazole (PRILOSEC) 20 MG capsule Take 20 mg by mouth daily as needed (acid reflux).      zonisamide  (ZONEGRAN ) 100 MG capsule Take 2 capsules every night 180 capsule 3   No current facility-administered medications for this visit.    Past Medical History:  Diagnosis Date   Epilepsia     Past Surgical History:  Procedure Laterality Date   ABLATION     COLONOSCOPY WITH PROPOFOL  N/A 09/29/2017   Procedure: COLONOSCOPY WITH PROPOFOL ;  Surgeon: Suzette Espy, MD;  [facility]: [facility];  Service: Endoscopy;  Laterality: N/A;  2:15pm   KNEE SURGERY  08/15/2021   POLYPECTOMY  09/29/2017   Procedure: POLYPECTOMY;  Surgeon: Suzette Espy, MD;  Location: [facility];  Service: Endoscopy;;  ascending colon polyp, descending colon polyp, recto sigmoid colon polyps x3   TUBAL LIGATION      Family History  Problem Relation Age of Onset   Cancer Mother        ovarian in the 1980s   Cervical cancer Maternal Grandmother    Lung cancer Other        3 paternal aunts   Cancer Paternal Uncle        unknown type   Colon cancer Neg Hx     Allergies as of 11/12/2023   (No Known Allergies)    Social History  Socioeconomic History   Marital status: Divorced    Spouse name: Not on file   Number of children: 1   Years of education: Not on file   Highest education level: Not on file  Occupational History   Not on file  Tobacco Use   Smoking status: Every Day    Current packs/day: 1.00    Average packs/day: 1 pack/day for 35.0 years (35.0 ttl pk-yrs)    Types: Cigarettes   Smokeless tobacco: Never  Vaping Use   Vaping status: Every Day  Substance and Sexual Activity   Alcohol use: Yes    Comment: rare   Drug use: Yes    Types: Marijuana   Sexual activity: Yes    Birth control/protection: Surgical  Other Topics Concern   Not on file  Social History Narrative   Are you right handed or left handed? Right handed    Are you currently employed ?   no   What is your current occupation? Disability    Do you live at home alone? alone   Who lives with you? no   What type of home do you live in: 1 story or 2 story? 1 story has 7 steps to get into home     Grandma    Social Drivers of Health   Financial Resource Strain: Not on file  Food Insecurity: Not on file  Transportation Needs: Not on file  Physical Activity: Inactive (03/06/2023)   Received from [facility]   Exercise Vital Sign    Days of Exercise per Week: 0 days    Minutes of Exercise per Session: 0 min  Stress: No Stress Concern Present (03/06/2023)   Received from [facility] - Occupational Stress Questionnaire    Feeling of Stress : Not at all  Social Connections: Not on file  Intimate Partner Violence: Unknown (03/06/2023)   Received from [facility]   Humiliation, Afraid, Rape, and Kick questionnaire    Fear of Current or Ex-Partner: No    Emotionally Abused: No    Physically Abused: No    Sexually Abused: Not on file     Review of Systems   Gen: Denies any fever, chills, fatigue, weight loss, lack of appetite.  CV: Denies chest pain, heart palpitations, peripheral edema, syncope.  Resp: Denies shortness of breath at rest or with exertion. Denies wheezing or cough.  GI: see HPI *** GU : Denies urinary burning, urinary frequency, urinary hesitancy MS: Denies joint pain, muscle weakness, cramps, or limitation of movement.  Derm: Denies rash, itching, dry skin Psych: Denies depression, anxiety, memory loss, and confusion Heme: Denies bruising, bleeding, and enlarged lymph nodes.  Physical Exam   There were no vitals taken for this visit.  General:   Alert and oriented. Pleasant and cooperative. Well-nourished and well-developed.  Head:  Normocephalic and atraumatic. Eyes:  Without icterus, sclera clear and conjunctiva pink.  Ears:  Normal auditory acuity. Mouth:  No deformity or lesions, oral mucosa  pink.  Lungs:  Clear to auscultation bilaterally. No wheezes, rales, or rhonchi. No distress.  Heart:  S1, S2 present without murmurs appreciated.  Abdomen:  +BS, soft, non-tender and non-distended. No HSM noted. No guarding or rebound. No masses appreciated.  Rectal:  Deferred *** Msk:  Symmetrical without gross deformities. Normal posture. Extremities:  Without edema. Neurologic:  Alert and  oriented x4;  grossly normal neurologically. Skin:  Intact without significant lesions or rashes. Psych:  Alert and cooperative. Normal mood and affect.  Assessment   Brittany Arellano is a 58 y.o. female with a history of seizures, hayfever, HDL, tobacco use, costochondritis, urge incontinence, and chronic cough presenting today to further discuss fatty liver.  Hepatic steatosis:   LLQ pain:   PLAN   *** CBC, CMP, INR, Fibrosure, ELF Mediterranean diet Weight loss   Julian Obey, MSN, FNP-BC, AGACNP-BC [facility]

## 2023-11-12 ENCOUNTER — Ambulatory Visit (INDEPENDENT_AMBULATORY_CARE_PROVIDER_SITE_OTHER): Admitting: Gastroenterology

## 2023-11-12 ENCOUNTER — Encounter: Payer: Self-pay | Admitting: Gastroenterology

## 2023-11-12 VITALS — BP 142/81 | HR 84 | Temp 98.4°F | Ht 62.0 in | Wt 177.4 lb

## 2023-11-12 DIAGNOSIS — K760 Fatty (change of) liver, not elsewhere classified: Secondary | ICD-10-CM | POA: Diagnosis not present

## 2023-11-12 NOTE — Patient Instructions (Addendum)
 Please have blood work completed at [facility].  We will call you with results once they have been received. Please allow 3-5 business days for review. 2 locations for [facility] in [place]:              1. [address], [place]              2. [address] [place], [place]    Continue to stay active on a regular basis.  Goal is always 150 minutes weekly of moderate intensity exercise.  Continue to work on weight loss.  This as well as a Mediterranean diet can help manage fatty liver.  I attached some information about Mediterranean diet to the back of your paperwork today.  As long as you do not have any elevation of the liver enzymes or others no significant fibrosis then we will plan for annual monitoring of your liver enzymes and periodic ultrasounds if needed.  If you do meet criteria for the new medication for liver fibrosis in regards to fatty liver then we can initiate that process or discuss that further.  It was a pleasure to see you today! I want to create trusting relationships with patients. If you receive a survey regarding your visit,  I greatly appreciate you taking time to fill this out on paper or through your MyChart. I value your feedback.  Julian Obey, MSN, FNP-BC, AGACNP-BC [facility]

## 2023-11-13 LAB — COMPREHENSIVE METABOLIC PANEL WITH GFR
ALT: 13 IU/L (ref 0–32)
AST: 20 IU/L (ref 0–40)
Albumin: 4.6 g/dL (ref 3.8–4.9)
Alkaline Phosphatase: 141 IU/L — ABNORMAL HIGH (ref 44–121)
BUN/Creatinine Ratio: 17 (ref 9–23)
BUN: 15 mg/dL (ref 6–24)
Bilirubin Total: <0.2 mg/dL (ref 0.0–1.2)
CO2: 20 mmol/L (ref 20–29)
Calcium: 9.3 mg/dL (ref 8.7–10.2)
Chloride: 105 mmol/L (ref 96–106)
Creatinine, Ser: 0.87 mg/dL (ref 0.57–1.00)
Globulin, Total: 2.3 g/dL (ref 1.5–4.5)
Glucose: 164 mg/dL — ABNORMAL HIGH (ref 70–99)
Potassium: 3.9 mmol/L (ref 3.5–5.2)
Sodium: 143 mmol/L (ref 134–144)
Total Protein: 6.9 g/dL (ref 6.0–8.5)
eGFR: 78 mL/min/{1.73_m2} (ref >59)

## 2023-11-13 LAB — CBC
Hematocrit: 48.3 % — ABNORMAL HIGH (ref 34.0–46.6)
Hemoglobin: 15.4 g/dL (ref 11.1–15.9)
MCH: 27.7 pg (ref 26.6–33.0)
MCHC: 31.9 g/dL (ref 31.5–35.7)
MCV: 87 fL (ref 79–97)
Platelets: 324 10*3/uL (ref 150–450)
RBC: 5.56 x10E6/uL — ABNORMAL HIGH (ref 3.77–5.28)
RDW: 13.9 % (ref 11.7–15.4)
WBC: 8.7 10*3/uL (ref 3.4–10.8)

## 2023-11-13 LAB — NASH FIBROSURE(R) PLUS

## 2023-11-14 ENCOUNTER — Telehealth: Payer: Self-pay | Admitting: Gastroenterology

## 2023-11-14 ENCOUNTER — Ambulatory Visit: Payer: Self-pay | Admitting: Gastroenterology

## 2023-11-14 LAB — NASH FIBROSURE(R) PLUS
ALPHA 2-MACROGLOBULINS, QN: 202 mg/dL (ref 110–276)
ALT (SGPT) P5P: 17 IU/L (ref 0–40)
AST (SGOT) P5P: 17 IU/L (ref 0–40)
Apolipoprotein A-1: 133 mg/dL (ref 116–209)
Bilirubin, Total: 0.1 mg/dL (ref 0.0–1.2)
Cholesterol, Total: 293 mg/dL — ABNORMAL HIGH (ref 100–199)
Fibrosis Score: 0.08 (ref 0.00–0.21)
GGT: 39 IU/L (ref 0–60)
Glucose: 163 mg/dL — ABNORMAL HIGH (ref 70–99)
Haptoglobin: 166 mg/dL (ref 33–346)
NASH Score: 0.22 (ref 0.00–0.25)
Steatosis Score: 0.86 — ABNORMAL HIGH (ref 0.00–0.40)
Triglycerides: 443 mg/dL — ABNORMAL HIGH (ref 0–149)

## 2023-11-14 LAB — HEMOGLOBIN A1C
Est. average glucose Bld gHb Est-mCnc: 123 mg/dL
Hgb A1c MFr Bld: 5.9 % — ABNORMAL HIGH (ref 4.8–5.6)

## 2023-11-14 LAB — HCV AB W REFLEX TO QUANT PCR: HCV Ab: NONREACTIVE

## 2023-11-14 LAB — ENHANCED LIVER FIBROSIS (ELF): ELF(TM) Score: 9.99 — ABNORMAL HIGH (ref <9.80)

## 2023-11-14 LAB — HCV INTERPRETATION

## 2023-11-14 NOTE — Telephone Encounter (Signed)
 Please recall patient for OV in 1 year for follow up fatty liver disease.

## 2024-01-19 ENCOUNTER — Other Ambulatory Visit: Payer: Self-pay

## 2024-01-19 ENCOUNTER — Encounter (HOSPITAL_COMMUNITY): Payer: Self-pay

## 2024-01-19 ENCOUNTER — Emergency Department (HOSPITAL_COMMUNITY)
Admission: EM | Admit: 2024-01-19 | Discharge: 2024-01-19 | Disposition: A | Discharge status: Home / Self Care | Attending: Emergency Medicine | Admitting: Emergency Medicine

## 2024-01-19 ENCOUNTER — Emergency Department (HOSPITAL_COMMUNITY)

## 2024-01-19 DIAGNOSIS — R0789 Other chest pain: Secondary | ICD-10-CM | POA: Insufficient documentation

## 2024-01-19 DIAGNOSIS — R079 Chest pain, unspecified: Secondary | ICD-10-CM | POA: Diagnosis present

## 2024-01-19 HISTORY — DX: Fatty (change of) liver, not elsewhere classified: K76.0

## 2024-01-19 LAB — CBC WITH DIFFERENTIAL/PLATELET
Abs Immature Granulocytes: 0.05 K/uL (ref 0.00–0.07)
Basophils Absolute: 0.1 K/uL (ref 0.0–0.1)
Basophils Relative: 1 %
Eosinophils Absolute: 0.1 K/uL (ref 0.0–0.5)
Eosinophils Relative: 1 %
HCT: 49.6 % — ABNORMAL HIGH (ref 36.0–46.0)
Hemoglobin: 16 g/dL — ABNORMAL HIGH (ref 12.0–15.0)
Immature Granulocytes: 1 %
Lymphocytes Relative: 32 %
Lymphs Abs: 2.3 K/uL (ref 0.7–4.0)
MCH: 27.8 pg (ref 26.0–34.0)
MCHC: 32.3 g/dL (ref 30.0–36.0)
MCV: 86.3 fL (ref 80.0–100.0)
Monocytes Absolute: 0.4 K/uL (ref 0.1–1.0)
Monocytes Relative: 6 %
Neutro Abs: 4.2 K/uL (ref 1.7–7.7)
Neutrophils Relative %: 59 %
Platelets: 334 K/uL (ref 150–400)
RBC: 5.75 MIL/uL — ABNORMAL HIGH (ref 3.87–5.11)
RDW: 14.4 % (ref 11.5–15.5)
WBC: 7.2 K/uL (ref 4.0–10.5)
nRBC: 0 % (ref 0.0–0.2)

## 2024-01-19 LAB — COMPREHENSIVE METABOLIC PANEL WITH GFR
ALT: 15 U/L (ref 0–44)
AST: 16 U/L (ref 15–41)
Albumin: 4.1 g/dL (ref 3.5–5.0)
Alkaline Phosphatase: 113 U/L (ref 38–126)
Anion gap: 11 (ref 5–15)
BUN: 18 mg/dL (ref 6–20)
CO2: 23 mmol/L (ref 22–32)
Calcium: 9.1 mg/dL (ref 8.9–10.3)
Chloride: 109 mmol/L (ref 98–111)
Creatinine, Ser: 0.92 mg/dL (ref 0.44–1.00)
GFR, Estimated: >60 mL/min (ref >60)
Glucose, Bld: 109 mg/dL — ABNORMAL HIGH (ref 70–99)
Potassium: 3.8 mmol/L (ref 3.5–5.1)
Sodium: 143 mmol/L (ref 135–145)
Total Bilirubin: 0.6 mg/dL (ref 0.0–1.2)
Total Protein: 7.8 g/dL (ref 6.5–8.1)

## 2024-01-19 LAB — TROPONIN I (HIGH SENSITIVITY)
Troponin I (High Sensitivity): <2 ng/L (ref <18)
Troponin I (High Sensitivity): <2 ng/L

## 2024-01-19 MED ORDER — NITROGLYCERIN 0.4 MG SL SUBL
0.4000 mg | SUBLINGUAL_TABLET | Freq: Once | SUBLINGUAL | Status: AC
  Administered 2024-01-19: 0.4 mg via SUBLINGUAL
  Filled 2024-01-19: qty 1

## 2024-01-19 MED ORDER — PANTOPRAZOLE SODIUM 20 MG PO TBEC: 20.0000 mg | DELAYED_RELEASE_TABLET | Freq: Every day | ORAL | 0 refills | Status: AC

## 2024-01-19 MED ORDER — PANTOPRAZOLE SODIUM 40 MG IV SOLR
40.0000 mg | Freq: Once | INTRAVENOUS | Status: AC
  Administered 2024-01-19: 40 mg via INTRAVENOUS
  Filled 2024-01-19: qty 10

## 2024-01-19 NOTE — ED Triage Notes (Signed)
 Pt arrived via [facility] from home c/o sharp, tight sternal chest pain that began this morning. EMS administered 4 baby aspirin PTA and Pt reports a little relief.

## 2024-01-19 NOTE — ED Provider Notes (Signed)
 [facility] AT [facility] Provider Note   CSN: [id number] Arrival date & time: 01/19/24  1003     Patient presents with: Chest Pain   Brittany Arellano is a 58 y.o. female.  {Add pertinent medical, surgical, social history, OB history to YEP:67052} Patient states that she was having some chest discomfort.  No sweating no shortness of breath.   Chest Pain      Prior to Admission medications   Medication Sig Start Date End Date Taking? Authorizing Provider  pantoprazole  (PROTONIX ) 20 MG tablet Take 1 tablet (20 mg total) by mouth daily. 01/19/24  Yes Mann Skaggs, MD  atorvastatin (LIPITOR) 10 MG tablet 1 tablet Orally Once a day for 30 day(s) 07/11/23   [provider]  fexofenadine (ALLEGRA) 180 MG tablet Take 180 mg by mouth daily.    [provider]  LAMICTAL  150 MG tablet Take 1 tablet (150 mg total) by mouth 2 (two) times daily. 09/15/23   Georjean Darice HERO, MD  meloxicam (MOBIC) 7.5 MG tablet Take 15 mg by mouth daily. 05/08/21   [provider]  Omega-3 Fatty Acids (FISH OIL) 1000 MG CAPS Take 1,000 mg by mouth daily.    [provider]  omeprazole (PRILOSEC) 20 MG capsule Take 20 mg by mouth daily as needed (acid reflux).     [provider]  zonisamide  (ZONEGRAN ) 100 MG capsule Take 2 capsules every night 07/09/23   Georjean Darice HERO, MD    Allergies: Patient has no known allergies.    Review of Systems  Cardiovascular:  Positive for chest pain.    Updated Vital Signs BP 136/69   Pulse 78   Temp 97.9 F (36.6 C) (Oral)   Resp 14   Ht 5' 2 (1.575 m)   Wt 80.5 kg   SpO2 95%   BMI 32.46 kg/m   Physical Exam  (all labs ordered are listed, but only abnormal results are displayed) Labs Reviewed  COMPREHENSIVE METABOLIC PANEL WITH GFR - Abnormal; Notable for the following components:      Result Value   Glucose, Bld 109 (*)    All other components within normal limits  CBC WITH  DIFFERENTIAL/PLATELET - Abnormal; Notable for the following components:   RBC 5.75 (*)    Hemoglobin 16.0 (*)    HCT 49.6 (*)    All other components within normal limits  TROPONIN I (HIGH SENSITIVITY)  TROPONIN I (HIGH SENSITIVITY)    EKG: EKG Interpretation Date/Time:  Monday January 19 2024 10:18:30 EDT Ventricular Rate:  80 PR Interval:  147 QRS Duration:  101 QT Interval:  373 QTC Calculation: 431 R Axis:   147  Text Interpretation: Sinus rhythm Low voltage, precordial leads Consider right ventricular hypertrophy Confirmed by Suzette Pac [phone]) on 01/19/2024 1:17:07 PM  Radiology: [facility] Chest 2 View Result Date: 01/19/2024 CLINICAL DATA:  Tight sternal chest pain beginning this morning. EXAM: CHEST - 2 VIEW COMPARISON:  04/08/2019 FINDINGS: Lungs are adequately inflated and otherwise clear. Cardiomediastinal silhouette and remainder of the exam is unchanged. IMPRESSION: No active cardiopulmonary disease. Electronically Signed   By: Toribio Agreste M.D.   On: 01/19/2024 10:46    {Document cardiac monitor, telemetry assessment procedure when appropriate:32947} Procedures   Medications Ordered in the ED  pantoprazole  (PROTONIX ) injection 40 mg (40 mg Intravenous Given 01/19/24 1042)  nitroGLYCERIN  (NITROSTAT ) SL tablet 0.4 mg (0.4 mg Sublingual Given 01/19/24 1042)     Patient did get some help with 1  nitroglycerin .  Her troponins were normal. {Click here for ABCD2, HEART and other calculators REFRESH Note before signing:1}                              Medical Decision Making Amount and/or Complexity of Data Reviewed Labs: ordered. Radiology: ordered. ECG/medicine tests: ordered.  Risk Prescription drug management.   Patient with atypical chest discomfort.  She is sent home with Protonix  and a baby aspirin and will follow-up with cardiology  {Document critical care time when appropriate  Document review of labs and clinical decision tools ie CHADS2VASC2, etc  Document your  independent review of radiology images and any outside records  Document your discussion with family members, caretakers and with consultants  Document social determinants of health affecting pt's care  Document your decision making why or why not admission, treatments were needed:32947:::1}   Final diagnoses:  Atypical chest pain    ED Discharge Orders          Ordered    pantoprazole  (PROTONIX ) 20 MG tablet  Daily        01/19/24 1321    Ambulatory referral to Cardiology       Comments: If you have not heard from the Cardiology office within the next 72 hours please call [phone].   01/19/24 1322

## 2024-01-19 NOTE — Discharge Instructions (Signed)
 Take 1 baby aspirin a day.  Follow-up with the cardiologist in the next week or 2

## 2024-04-27 ENCOUNTER — Encounter: Payer: Self-pay | Admitting: Neurology

## 2024-06-29 ENCOUNTER — Ambulatory Visit (INDEPENDENT_AMBULATORY_CARE_PROVIDER_SITE_OTHER): Payer: Medicare HMO | Admitting: Neurology

## 2024-06-29 ENCOUNTER — Encounter: Payer: Self-pay | Admitting: Neurology

## 2024-06-29 VITALS — BP 137/69 | HR 102 | Wt 155.8 lb

## 2024-06-29 DIAGNOSIS — G40309 Generalized idiopathic epilepsy and epileptic syndromes, not intractable, without status epilepticus: Secondary | ICD-10-CM | POA: Diagnosis not present

## 2024-06-29 MED ORDER — ZONISAMIDE 100 MG PO CAPS: ORAL_CAPSULE | ORAL | 3 refills | Status: AC

## 2024-06-29 MED ORDER — LAMICTAL 150 MG PO TABS: 150.0000 mg | ORAL_TABLET | Freq: Two times a day (BID) | ORAL | 11 refills | Status: AC

## 2024-06-29 NOTE — Progress Notes (Signed)
 "  NEUROLOGY FOLLOW UP OFFICE NOTE  Brittany Arellano [id number] 11-01-1965  Discussed the use of AI scribe software for clinical note transcription with the patient, who gave verbal consent to proceed.  History of Present Illness I had the pleasure of seeing Brittany Arellano in follow-up in the neurology clinic on 06/29/2024.  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 She is alone in the office today.  Records and images were personally reviewed where available.  Since her last visit, she continues to do well seizure-free for 14 years. She takes the brand Lamictal  150mg  at night and Zonisamide  100mg  at night without side effects. He/She denies any staring/unresponsive episodes, gaps in time, olfactory/gustatory hallucinations, focal numbness/tingling/weakness, myoclonic jerks. No significant headaches, dizziness, vision changes, no falls. Sometimes she gets only 4 hours of sleep which she attributes to her current social situation. She denies any falls in the past year, there are plans for left knee surgery.    History On Initial Assessment 08/21/2022: This is a pleasant 59 year old right-handed woman presenting for transfer of care for seizures. Her prior neurologist Dr. [name] has closed his practice. Seizures started at age 27. She denies any prior warning symptoms. She has been told she stares or stops in a middle of a conversation for a few seconds. She catches herself every once in a while and states it does not happen a lot. She reports myoclonic jerks especially in the morning, occurring 1-2 times a month. She recalls several years ago waking up feeling sore but not sure. She reports last GTC was around 12 years ago, seizures stopped when she was started on Zonisamide  as part of a study at [place]. She states that brand Lamictal  150mg  is prescribed BID but she has been taking it only qhs for the past 20 years. Zonisamide  100mg  is prescribed 2 caps qhs but she only takes 1 cap  qhs. She denies any olfactory/gustatory hallucinations, focal numbness/tingling/weakness. She has headaches that she attributes to her sinuses. No nausea/vomiting, photo/phonophobia. She has noticed some difficulty swallowing. No neck/back pain, bowel/bladder dysfunction. 59 is constantly feeling the urge to move her legs at night, she puts a weighted blanket and needs to walk around. She has some memory changes, needing a lot of reminders and alarms. She denies missing bill payments. She does not drive. She usually gets 6-7 hours of sleep, THC gummies help. She has some difficulty with sleep initiation. Mood varies, she reports she is undergoing menopause and patience is not what it used to be. She drinks alcohol socially.   Epilepsy Risk Factors:  She had a normal birth and early development.  There is no history of febrile convulsions, CNS infections such as meningitis/encephalitis, significant traumatic brain injury, neurosurgical procedures, or family history of seizures.  Prior ASMs: Depakote, generic Lamotrigine  (more seizures)  Diagnostic Data: EEGs: Per notes: EEG showed a single episode of generalized epileptiform discharge EEG 08/2022 normal  MRI: [facility] brain [facility] was normal.   PAST MEDICAL HISTORY: Past Medical History:  Diagnosis Date   Epilepsia    Fatty liver     MEDICATIONS: Medications Ordered Prior to Encounter[1]  ALLERGIES: Allergies[2]  FAMILY HISTORY: Family History  Problem Relation Age of Onset   Cancer Mother        ovarian in the 11s   Cervical cancer Maternal Grandmother    Cancer Paternal Uncle        unknown type   Cirrhosis Paternal Uncle  COPD Paternal Uncle    Lung cancer Other        3 paternal aunts   Colon cancer Neg Hx     SOCIAL HISTORY: Social History   Socioeconomic History   Marital status: Divorced    Spouse name: Not on file   Number of children: 1   Years of education: Not on file   Highest education level:  Not on file  Occupational History   Not on file  Tobacco Use   Smoking status: Every Day    Current packs/day: 1.00    Average packs/day: 1 pack/day for 35.0 years (35.0 ttl pk-yrs)    Types: Cigarettes    Passive exposure: Current   Smokeless tobacco: Never  Vaping Use   Vaping status: Every Day  Substance and Sexual Activity   Alcohol use: Yes    Comment: rare   Drug use: Yes    Types: Marijuana   Sexual activity: Yes    Birth control/protection: Surgical  Other Topics Concern   Not on file  Social History Narrative   Are you right handed or left handed? Right handed    Are you currently employed ?  no   What is your current occupation? Disability    Do you live at home alone? alone   Who lives with you? no   What type of home do you live in: 1 story or 2 story? 1 story has 7 steps to get into home     Grandma    Social Drivers of Health   Tobacco Use: High Risk (05/20/2024)   Received [facility]   Patient History    Smoking Tobacco Use: Every Day    Smokeless Tobacco Use: Never    Passive Exposure: Not on file  Financial Resource Strain: Low Risk (05/14/2024)   Received from [facility]   Overall Financial Resource Strain (CARDIA)    How hard is it for you to pay for the very basics like food, housing, medical care, and heating?: Not hard at all  Food Insecurity: No Food Insecurity (05/20/2024)   Received from [facility]   Epic    Within the past 12 months, you worried that your food would run out before you got the money to buy more.: Never true    Within the past 12 months, the food you bought just didn't last and you didn't have money to get more.: Never true  Transportation Needs: No Transportation Needs (05/20/2024)   Received from [facility] - Transportation    Lack of Transportation (Medical): No    Lack of Transportation (Non-Medical): No  Physical Activity: Insufficiently Active (05/20/2024)   Received from [facility]    Exercise Vital Sign    On average, how many days per week do you engage in moderate to strenuous exercise (like a brisk walk)?: 1 day    On average, how many minutes do you engage in exercise at this level?: 40 min  Stress: Stress Concern Present (05/20/2024)   Received from [facility] - Occupational Stress Questionnaire    Do you feel stress - tense, restless, nervous, or anxious, or unable to sleep at night because your mind is troubled all the time - these days?: Very much  Social Connections: Not on file  Intimate Partner Violence: Unknown (03/06/2023)   Received from [facility]   Epic    Within the  last year, have you been afraid of your partner or ex-partner?: No    Within the last year, have you been humiliated or emotionally abused in other ways by your partner or ex-partner?: No    Within the last year, have you been kicked, hit, slapped, or otherwise physically hurt by your partner or ex-partner?: No    Sexually Abused: Not on file  Depression (EYV7-0): Not on file  Alcohol Screen: Not on file  Housing: Not on file  Utilities: Low Risk (05/20/2024)   Received from [facility]   Utilities    Within the past 12 months, have you been unable to get utilities(heat, electricity) when it was really needed?: No  Health Literacy: Low Risk (05/20/2024)   Received from [facility]    How often do you need to have someone help you when you read instructions, pamphlets, or other written material from your doctor or pharmacy?: Never     PHYSICAL EXAM: Vitals:   06/29/24 0937  BP: 137/69  Pulse: (!) 102  SpO2: 98%   General: No acute distress Head:  Normocephalic/atraumatic Skin/Extremities: No rash, no edema Neurological Exam: alert and awake. No aphasia or dysarthria. Fund of knowledge is appropriate. Attention and concentration are normal.   Cranial nerves: Pupils equal, round. Extraocular movements  intact with no nystagmus. Visual fields full.  No facial asymmetry.  Motor: Bulk and tone normal, muscle strength 5/5 throughout with no pronator drift.   Finger to nose testing intact.  Gait narrow-based, no ataxia. No tremors.    IMPRESSION: This is a pleasant 59 yo RH woman with a history of primary generalized epilepsy. Per notes, prior EEG showed a single episode of generalized discharge (report unavailable for review), EEG in 2024 normal. MRI brain normal. She denies any convulsions in 14 years. Continue brand Lamictal  and Zonisamide , refills sent. She had more seizures on generic Lamotrigine . She is aware of [place] driving laws to stop driving after a seizure until 6 months seizure-free. Follow-up in 1 year, call for any changes.    Thank you for allowing me to participate in her care.  Please do not hesitate to call for any questions or concerns.    Darice Shivers, M.D.   CC: Maeola Ee, FNP      [1]  Current Outpatient Medications on File Prior to Visit  Medication Sig Dispense Refill   atorvastatin (LIPITOR) 10 MG tablet 1 tablet Orally Once a day for 30 day(s)     fexofenadine (ALLEGRA) 180 MG tablet Take 180 mg by mouth daily.     LAMICTAL  150 MG tablet Take 1 tablet (150 mg total) by mouth 2 (two) times daily. 60 tablet 11   meloxicam (MOBIC) 7.5 MG tablet Take 15 mg by mouth daily.     Omega-3 Fatty Acids (FISH OIL) 1000 MG CAPS Take 1,000 mg by mouth daily.     omeprazole (PRILOSEC) 20 MG capsule Take 20 mg by mouth daily as needed (acid reflux).      pantoprazole  (PROTONIX ) 20 MG tablet Take 1 tablet (20 mg total) by mouth daily. 30 tablet 0   zonisamide  (ZONEGRAN ) 100 MG capsule Take 2 capsules every night 180 capsule 3   No current facility-administered medications on file prior to visit.  [2] No Known Allergies  "

## 2024-06-29 NOTE — Patient Instructions (Signed)
 Good to see you. Continue all your medications, refills sent. Let me know if any issues. Follow-up in 1 year, call for any changes.    Seizure Precautions: 1. If medication has been prescribed for you to prevent seizures, take it exactly as directed.  Do not stop taking the medicine without talking to your doctor first, even if you have not had a seizure in a long time.   2. Avoid activities in which a seizure would cause danger to yourself or to others.  Don't operate dangerous machinery, swim alone, or climb in high or dangerous places, such as on ladders, roofs, or girders.  Do not drive unless your doctor says you may.  3. If you have any warning that you may have a seizure, lay down in a safe place where you can't hurt yourself.    4.  No driving for 6 months from last seizure, as per [place]  state law.   Please refer to the following link on the [employer]'s website for more information: http://www.epilepsyfoundation.org/answerplace/Social/driving/drivingu.cfm   5.  Maintain good sleep hygiene. Avoid alcohol.  6.  Contact your doctor if you have any problems that may be related to the medicine you are taking.  7.  Call 911 and bring the patient back to the ED if:        A.  The seizure lasts longer than 5 minutes.       B.  The patient doesn't awaken shortly after the seizure  C.  The patient has new problems such as difficulty seeing, speaking or moving  D.  The patient was injured during the seizure  E.  The patient has a temperature over 102 F (39C)  F.  The patient vomited and now is having trouble breathing

## 2024-07-16 ENCOUNTER — Telehealth: Payer: Self-pay

## 2024-07-16 NOTE — Telephone Encounter (Signed)
 Pt needs a PA on Lamictal  150 mg

## 2024-07-20 ENCOUNTER — Telehealth: Payer: Self-pay | Admitting: Pharmacy Technician

## 2024-07-20 ENCOUNTER — Other Ambulatory Visit (HOSPITAL_COMMUNITY): Payer: Self-pay

## 2024-07-20 NOTE — Telephone Encounter (Signed)
 Pharmacy Patient Advocate Encounter   Received notification from Pt Calls Messages that prior authorization for LAMICTAL  150MG  is required/requested.   Insurance verification completed.   The patient is insured through [facility].   Per test claim: PA required; PA submitted to above mentioned insurance via Latent Key/confirmation #/EOC [id number] Status is pending

## 2024-07-20 NOTE — Telephone Encounter (Signed)
 PA has been submitted, and telephone encounter has been created. Please see telephone encounter dated 2.3.26.

## 2025-06-29 ENCOUNTER — Ambulatory Visit: Payer: Self-pay | Admitting: Neurology
